# Patient Record
Sex: Female | Born: 1955
Health system: Southern US, Community
[De-identification: ages and names within clinical notes are randomized; demographics above are authoritative.]

## PROBLEM LIST (undated history)

## (undated) DIAGNOSIS — I10 Essential (primary) hypertension: Secondary | ICD-10-CM

## (undated) DIAGNOSIS — N950 Postmenopausal bleeding: Secondary | ICD-10-CM

## (undated) DIAGNOSIS — R21 Rash and other nonspecific skin eruption: Secondary | ICD-10-CM

## (undated) DIAGNOSIS — E079 Disorder of thyroid, unspecified: Secondary | ICD-10-CM

## (undated) HISTORY — PX: COLONOSCOPY: SHX174

## (undated) HISTORY — DX: Disorder of thyroid, unspecified: E07.9

## (undated) HISTORY — DX: Essential (primary) hypertension: I10

## (undated) HISTORY — PX: THYROID SURGERY: SHX805

## (undated) HISTORY — PX: TUBAL LIGATION: SHX77

---

## 2001-04-08 ENCOUNTER — Emergency Department (HOSPITAL_COMMUNITY): Admission: EM | Admit: 2001-04-08 | Discharge: 2001-04-08 | Payer: Self-pay | Admitting: Emergency Medicine

## 2005-02-13 ENCOUNTER — Emergency Department (HOSPITAL_COMMUNITY): Admission: EM | Admit: 2005-02-13 | Discharge: 2005-02-13 | Payer: Self-pay | Admitting: Emergency Medicine

## 2006-05-05 ENCOUNTER — Emergency Department (HOSPITAL_COMMUNITY): Admission: EM | Admit: 2006-05-05 | Discharge: 2006-05-05 | Payer: Self-pay | Admitting: Emergency Medicine

## 2006-08-13 ENCOUNTER — Emergency Department (HOSPITAL_COMMUNITY): Admission: EM | Admit: 2006-08-13 | Discharge: 2006-08-13 | Payer: Self-pay | Admitting: Emergency Medicine

## 2010-07-16 ENCOUNTER — Encounter: Admission: RE | Admit: 2010-07-16 | Discharge: 2010-07-16 | Payer: Self-pay | Admitting: Internal Medicine

## 2010-07-24 ENCOUNTER — Encounter: Admission: RE | Admit: 2010-07-24 | Discharge: 2010-07-24 | Payer: Self-pay | Admitting: Internal Medicine

## 2010-07-24 ENCOUNTER — Other Ambulatory Visit
Admission: RE | Admit: 2010-07-24 | Discharge: 2010-07-24 | Payer: Self-pay | Source: Home / Self Care | Admitting: Interventional Radiology

## 2010-08-13 ENCOUNTER — Ambulatory Visit (HOSPITAL_COMMUNITY)
Admission: RE | Admit: 2010-08-13 | Discharge: 2010-08-13 | Payer: Self-pay | Source: Home / Self Care | Attending: Internal Medicine | Admitting: Internal Medicine

## 2010-10-03 ENCOUNTER — Ambulatory Visit (HOSPITAL_COMMUNITY): Admission: RE | Admit: 2010-10-03 | Payer: BC Managed Care – PPO | Source: Home / Self Care | Admitting: General Surgery

## 2010-10-15 ENCOUNTER — Encounter (HOSPITAL_COMMUNITY)
Admission: RE | Admit: 2010-10-15 | Discharge: 2010-10-15 | Disposition: A | Discharge status: Home / Self Care | Payer: BC Managed Care – PPO | Source: Ambulatory Visit | Attending: General Surgery | Admitting: General Surgery

## 2010-10-15 DIAGNOSIS — Z01812 Encounter for preprocedural laboratory examination: Secondary | ICD-10-CM | POA: Insufficient documentation

## 2010-10-15 LAB — COMPREHENSIVE METABOLIC PANEL
BUN: 6 mg/dL (ref 6–23)
CO2: 29 mEq/L (ref 19–32)
Chloride: 105 mEq/L (ref 96–112)
Creatinine, Ser: 0.87 mg/dL (ref 0.4–1.2)
GFR calc non Af Amer: >60 mL/min (ref >60)
Total Bilirubin: 0.4 mg/dL (ref 0.3–1.2)

## 2010-10-15 LAB — DIFFERENTIAL
Basophils Relative: 0 % (ref 0–1)
Eosinophils Absolute: 0.4 10*3/uL (ref 0.0–0.7)
Eosinophils Relative: 5 % (ref 0–5)
Lymphs Abs: 3.2 10*3/uL (ref 0.7–4.0)
Neutrophils Relative %: 46 % (ref 43–77)

## 2010-10-15 LAB — URINALYSIS, ROUTINE W REFLEX MICROSCOPIC
Bilirubin Urine: NEGATIVE
Ketones, ur: NEGATIVE mg/dL
Nitrite: NEGATIVE
Specific Gravity, Urine: 1.005 (ref 1.005–1.030)
Urobilinogen, UA: 0.2 mg/dL (ref 0.0–1.0)

## 2010-10-15 LAB — CBC
MCV: 84 fL (ref 78.0–100.0)
Platelets: 243 10*3/uL (ref 150–400)
RDW: 15.1 % (ref 11.5–15.5)
WBC: 7.2 10*3/uL (ref 4.0–10.5)

## 2010-10-15 LAB — PROTIME-INR: INR: 0.93 (ref 0.00–1.49)

## 2010-10-15 LAB — SURGICAL PCR SCREEN
MRSA, PCR: NEGATIVE
Staphylococcus aureus: NEGATIVE

## 2010-10-19 ENCOUNTER — Other Ambulatory Visit: Payer: Self-pay | Admitting: General Surgery

## 2010-10-19 ENCOUNTER — Observation Stay (HOSPITAL_COMMUNITY)
Admission: RE | Admit: 2010-10-19 | Discharge: 2010-10-20 | Disposition: A | Discharge status: Home / Self Care | Payer: BC Managed Care – PPO | Source: Ambulatory Visit | Attending: General Surgery | Admitting: General Surgery

## 2010-10-19 DIAGNOSIS — E213 Hyperparathyroidism, unspecified: Secondary | ICD-10-CM | POA: Insufficient documentation

## 2010-10-19 DIAGNOSIS — I10 Essential (primary) hypertension: Secondary | ICD-10-CM | POA: Insufficient documentation

## 2010-10-19 DIAGNOSIS — E069 Thyroiditis, unspecified: Principal | ICD-10-CM | POA: Insufficient documentation

## 2010-10-19 LAB — CALCIUM: Calcium: 9.8 mg/dL (ref 8.4–10.5)

## 2010-10-22 NOTE — Op Note (Addendum)
Amy Schultz, Amy Schultz                 ACCOUNT NO.:  [id number]  MEDICAL RECORD NO.:  [id number]           PATIENT TYPE:  I  LOCATION:  2550                         FACILITY:  [facility]  PHYSICIAN:  Lennie Muckle, MD      DATE OF BIRTH:  06-25-1956  DATE OF PROCEDURE: DATE OF DISCHARGE:                              OPERATIVE REPORT   PREOPERATIVE DIAGNOSES: 1. Right thyroid nodule with follicular lesion. 2. Hyperparathyroidism.  POSTOPERATIVE DIAGNOSES: 1. Right thyroid nodule with follicular lesion. 2. Hyperparathyroidism.  PROCEDURE: 1. Right thyroidectomy. 2. Neck exploration.  SURGEON:  Karma Ansley L. Freida Busman, MD  ASSISTANT:  Velora Heckler, MD  ANESTHESIA:  General endotracheal anesthesia.  ESTIMATED BLOOD LOSS:  Minimal amount of blood loss.  SPECIMEN:  Right thyroid lobe permanent for pathology.  COMPLICATIONS:  No immediate complications.  INDICATION FOR PROCEDURE:  Ms. Vandermeulen is a 55 year old female who was originally seen for a right follicular nodule.  An ultrasound of the thyroid revealed a lesion within the right lobe of the thyroid.  FNA revealed hypercellular follicular lesion.  The ultrasound did not reveal any other abnormalities and no lesions in the left lobe.  Her initial evaluation also revealed hypercalcemia, PTH was elevated at 87.  She was sent for a sestamibi study for identification of a probable adenoma in the parathyroid gland.  In the interim, she had also seen Dr. Darnell Level. He agreed with the evaluation of removing the right lobe of the thyroid.  We felt that there was a probable parathyroid also on the right.  On initial discussion, we talked about the possibility of having to come back for a second surgery verus total thryoidectomy.    DETAILS OF PROCEDURE:  Ms. Dorman was identified in the preoperative holding area.  She received clindamycin and gentamicin.  She was seen by Anesthesia.  All questions were answered and she was taken to  the operating suite.  Once in the operating room, she was placed in supine position.  After administration of general endotracheal anesthesia, anterior neck was prepped and draped in usual sterile fashion.  Towel roll was placed beneath the shoulder blade.  She was placed in reversed Trendelenburg, head elevated up.  Surgical time-out performe.  I began by palpating the sternal cleft, measured 2 fingerbreadths above the sternal cleft and placed an approximately 7 cm incision.  Divided subcutaneous tissues with electrocautery.  Divided the platysma muscle, created flaps inferiorly and superiorly and localized the middle of the strap muscles. These were divided.  We elevated the strap muscles off the left thyroid. There were no palpable lesions and no enlarged glands identified.  We then proceeded to dissect the strap muscle off the right thyroid.  It was somewhat adhered to the gland from the previous FNA.  Using the Harmonic scalpel, I dissected the strap muscles off the thyroid.  There seemed to be a capsule around the thyroid, which was fibrous and thick.I dissected the inferior vessels first placing small clips on the inferior thyroid vessel and divided this with the Harmonic scalpel and then proceeded up towards the middle lobe of the  gland.  I freed up some of the adhesions of the strap muscle from the thyroid, then I was able to retract the gland inferiorly.  I felt the lesion within the thyroid. There was both the inferior and the superior lesion palpated.  We identified the superior feeding vessels, placed 2-0 silk ties and a large clip superiorly.  Divided this with Metzenbaum scissors.  A smaller feeding branch was clipped and divided.  This then allowed to retract the superior pole into the operative field.  I continued dissecting down towards the middle of the gland.  We were able to identify the recurrent laryngeal nerve.  I stayed high on the gland, well away from the nerve.   Pushing the nerve away from the gland, I used the Harmonic scalpel to dissect on the thyroid.  I clipped the smaller feeding vessels.  The middle thyroidal artery was also clipped and divided.  I was then able to continue sweeping the gland off the trachea using electrocautery.  The gland was divided in the isthmus with the Harmonic scalpel.  I marked a stitch in the superior pole.  I palpated and again, there was a lesion in the superior and inferior gland.  We then dissected laterally well identifying the nerve.  I found a small lymph node inferiorly.  I identified the thymus.  We did not find a large inferior parathyroid gland.  We dissected around the carotid groove, the tracheoesophageal groove and superiorly as well.  A normal- sized superior parathyroid was identified.  We reviewed the sestamibi scan once again.  It seemed that it localized in the vicinity of the follicular lesion.  We then decided to once again explore the left side of the neck.  We pulled the strap muscles off the thyroid, swept away the strap muscles in order to identify the full length of the gland.  No nodularity was noted in the thyroid.  We did not identify any enlargedparathyroid tissue on the left.  Therefore, it is presumed that perhaps the parathyroid was within the thyroid gland.  I irrigated the wound bed, placed Surgicel on both sides, closed the strap muscle at the midline with a 3-0 Vicryl running suture, re-approximated the platysma with 3-0 interrupted.  Injected 10 mL of 0.25% Marcaine with epinephrine for local anesthetic.  I reapproximated the dermis with 3-0 Vicryl and skin was closed with 4-0 Monocryl and Steri-Strips was placed for final dressing.  The patient was extubated, transferred to postanesthesia care unit in stable condition.  I will get a calcium today, repeat it tomorrow and then she will likely be discharged home tomorrow.     Lennie Muckle, MD     ALA/MEDQ  D:  10/19/2010   T:  10/19/2010  Job:  [id number]  cc:   Juline Patch, M.D. Fax: [phone]  Electronically Signed by Bertram Savin MD on 10/22/2010 11:51:22 AM

## 2010-11-13 NOTE — Discharge Summary (Signed)
  Amy Schultz, Amy Schultz                 ACCOUNT NO.:  [id number]  MEDICAL RECORD NO.:  [id number]           PATIENT TYPE:  LOCATION:                                 FACILITY:  PHYSICIAN:  Lennie Muckle, MD      DATE OF BIRTH:  1956-08-05  DATE OF ADMISSION:  10/21/2010 DATE OF DISCHARGE:  10/22/2010                              DISCHARGE SUMMARY   FINAL DIAGNOSIS:  Right thyroid nodule and primary hyperparathyroidism.  PROCEDURE:  On February 25 right parathyroidectomy and right thyroidectomy.  HOSPITAL COURSE:  Ms. Gonzalo was observed overnight following a right thyroidectomy and neck exploration.  She had had a primary hyperparathyroidism, had a lesion palpable in the thyroid which was assumed to be a parathyroid gland.  Underwent the procedure without difficulty.  Calcium on discharge was 9.3.  She will be discharged home with Vicodin for pain, restrict activities for 10 days, and then follow up with me in 2 or 3 weeks' time.  FINAL DIAGNOSIS:  Primary hyperparathyroidism and right thyroid nodule.     Lennie Muckle, MD     ALA/MEDQ  D:  11/05/2010  T:  11/06/2010  Job:  [id number]  Electronically Signed by Bertram Savin MD on 11/13/2010 11:48:17 AM

## 2010-11-28 ENCOUNTER — Other Ambulatory Visit: Payer: Self-pay | Admitting: Internal Medicine

## 2010-11-28 DIAGNOSIS — Z1231 Encounter for screening mammogram for malignant neoplasm of breast: Secondary | ICD-10-CM

## 2010-12-17 ENCOUNTER — Other Ambulatory Visit: Payer: Self-pay | Admitting: Women's Health

## 2010-12-17 ENCOUNTER — Other Ambulatory Visit (HOSPITAL_COMMUNITY)
Admission: RE | Admit: 2010-12-17 | Discharge: 2010-12-17 | Disposition: A | Discharge status: Home / Self Care | Payer: BC Managed Care – PPO | Source: Ambulatory Visit | Attending: Gynecology | Admitting: Gynecology

## 2010-12-17 ENCOUNTER — Ambulatory Visit (INDEPENDENT_AMBULATORY_CARE_PROVIDER_SITE_OTHER): Payer: BC Managed Care – PPO | Admitting: Women's Health

## 2010-12-17 DIAGNOSIS — Z124 Encounter for screening for malignant neoplasm of cervix: Secondary | ICD-10-CM | POA: Insufficient documentation

## 2010-12-17 DIAGNOSIS — Z01419 Encounter for gynecological examination (general) (routine) without abnormal findings: Secondary | ICD-10-CM

## 2010-12-17 DIAGNOSIS — N912 Amenorrhea, unspecified: Secondary | ICD-10-CM

## 2010-12-21 ENCOUNTER — Ambulatory Visit
Admission: RE | Admit: 2010-12-21 | Discharge: 2010-12-21 | Disposition: A | Discharge status: Home / Self Care | Payer: BC Managed Care – PPO | Source: Ambulatory Visit | Attending: Internal Medicine | Admitting: Internal Medicine

## 2010-12-21 ENCOUNTER — Ambulatory Visit: Payer: BC Managed Care – PPO

## 2010-12-21 DIAGNOSIS — Z1231 Encounter for screening mammogram for malignant neoplasm of breast: Secondary | ICD-10-CM

## 2010-12-25 ENCOUNTER — Other Ambulatory Visit: Payer: Self-pay | Admitting: Internal Medicine

## 2010-12-25 DIAGNOSIS — R928 Other abnormal and inconclusive findings on diagnostic imaging of breast: Secondary | ICD-10-CM

## 2010-12-28 ENCOUNTER — Other Ambulatory Visit: Payer: BC Managed Care – PPO

## 2010-12-28 ENCOUNTER — Ambulatory Visit
Admission: RE | Admit: 2010-12-28 | Discharge: 2010-12-28 | Disposition: A | Discharge status: Home / Self Care | Payer: BC Managed Care – PPO | Source: Ambulatory Visit | Attending: Internal Medicine | Admitting: Internal Medicine

## 2010-12-28 DIAGNOSIS — R928 Other abnormal and inconclusive findings on diagnostic imaging of breast: Secondary | ICD-10-CM

## 2011-01-29 ENCOUNTER — Encounter (INDEPENDENT_AMBULATORY_CARE_PROVIDER_SITE_OTHER): Payer: Self-pay | Admitting: General Surgery

## 2011-03-28 ENCOUNTER — Encounter: Payer: Self-pay | Admitting: Internal Medicine

## 2011-09-16 ENCOUNTER — Encounter: Payer: Self-pay | Admitting: Women's Health

## 2011-09-16 ENCOUNTER — Telehealth: Payer: Self-pay | Admitting: *Deleted

## 2011-09-16 ENCOUNTER — Ambulatory Visit (INDEPENDENT_AMBULATORY_CARE_PROVIDER_SITE_OTHER): Payer: BC Managed Care – PPO | Admitting: Women's Health

## 2011-09-16 VITALS — BP 138/80

## 2011-09-16 DIAGNOSIS — N951 Menopausal and female climacteric states: Secondary | ICD-10-CM

## 2011-09-16 MED ORDER — ESTRADIOL-NORETHINDRONE ACET 0.5-0.1 MG PO TABS: 1.0000 | ORAL_TABLET | Freq: Every day | ORAL | Status: DC

## 2011-09-16 NOTE — Telephone Encounter (Signed)
Estradiol 1 mg by mouth daily #30 with 4 refills and Provera 2.5 by mouth daily #30 with 4 refills. Remind patient she needs both medications since she has a uterus.

## 2011-09-16 NOTE — Progress Notes (Signed)
Patient ID: Amy Schultz, female   DOB: August 29, 1955, 56 y.o.   MRN: [id number] Presents with a complaint of increased hot flushes, difficulty losing weight, fatigue, and relates to menopause. History of an elevated FSH/70 in July of 2012. No bleeding since/BTL. Hypertensive on 1 medication per primary care, blood pressure stable.  Menopausal symptoms  Plan: Options reviewed of HRT, Effexor, supplements. HRT- risks of blood clots, strokes, and breast cancer reviewed. [facility] study reviewed. Would like to try HRT. Activella 0.5/.1 prescription, proper use, given reviewed. Will check blood pressure next week, keep followup with primary care for blood pressure. States she can have blood pressure checked at work. Will evaluate at annual exam or sooner if needed.

## 2011-09-16 NOTE — Telephone Encounter (Signed)
Pt was seen today c/o menopausal symptoms given Rx for Activella 0.5-01mg  is too expensive, pt would like a cheaper medication. Please advise

## 2011-09-17 MED ORDER — ESTRADIOL 1 MG PO TABS: 1.0000 mg | ORAL_TABLET | Freq: Every day | ORAL | Status: DC

## 2011-09-17 MED ORDER — MEDROXYPROGESTERONE ACETATE 2.5 MG PO TABS: 2.5000 mg | ORAL_TABLET | Freq: Every day | ORAL | Status: DC

## 2011-09-17 NOTE — Telephone Encounter (Signed)
Left the below note on pt vm that rx sent to pharmacy. Pt will call if questions.

## 2011-10-08 ENCOUNTER — Telehealth: Payer: Self-pay

## 2011-10-08 NOTE — Telephone Encounter (Signed)
Patient has been on daily Estradiol and Provera for couple of weeks. Past Friday night began with spotting and continued until Sunday began with a flow like menses and has been like that every day since.  Please advise.  Also, patient asked me "do these hormones cause cancer?".  I told her you would address that answer with her. Thanks.

## 2011-10-09 NOTE — Telephone Encounter (Signed)
Message left to call office in regards to problem

## 2011-10-09 NOTE — Telephone Encounter (Signed)
Left message on cell to call office in regards to problem.

## 2011-10-10 ENCOUNTER — Encounter (INDEPENDENT_AMBULATORY_CARE_PROVIDER_SITE_OTHER): Payer: Self-pay | Admitting: General Surgery

## 2011-10-10 NOTE — Telephone Encounter (Signed)
Telephone call to review HRT. Will continue the estradiol daily and take 5 mg of Provera day 1 through 10. Will stop daily Provera and take cyclic. Again reviewed risks of blood clots, strokes, breast cancer risk with HRT. States has felt better on HRT will continue and it reviewed to stay on less than 5 years.

## 2011-11-07 ENCOUNTER — Ambulatory Visit (INDEPENDENT_AMBULATORY_CARE_PROVIDER_SITE_OTHER): Payer: BC Managed Care – PPO

## 2011-11-07 ENCOUNTER — Encounter: Payer: Self-pay | Admitting: Women's Health

## 2011-11-07 ENCOUNTER — Other Ambulatory Visit: Payer: Self-pay | Admitting: Gynecology

## 2011-11-07 ENCOUNTER — Ambulatory Visit (INDEPENDENT_AMBULATORY_CARE_PROVIDER_SITE_OTHER): Payer: BC Managed Care – PPO | Admitting: Women's Health

## 2011-11-07 DIAGNOSIS — N83339 Acquired atrophy of ovary and fallopian tube, unspecified side: Secondary | ICD-10-CM

## 2011-11-07 DIAGNOSIS — R109 Unspecified abdominal pain: Secondary | ICD-10-CM

## 2011-11-07 DIAGNOSIS — I10 Essential (primary) hypertension: Secondary | ICD-10-CM | POA: Insufficient documentation

## 2011-11-07 LAB — URINALYSIS W MICROSCOPIC + REFLEX CULTURE
Bilirubin Urine: NEGATIVE
Hgb urine dipstick: NEGATIVE
Ketones, ur: NEGATIVE mg/dL
Protein, ur: NEGATIVE mg/dL
Urobilinogen, UA: 0.2 mg/dL (ref 0.0–1.0)

## 2011-11-07 LAB — WET PREP FOR TRICH, YEAST, CLUE: Trich, Wet Prep: NONE SEEN

## 2011-11-07 NOTE — Patient Instructions (Signed)
Colonoscopy  Dr  Loreta Ave, Dr. Elnoria Howard  [facility]

## 2011-11-07 NOTE — Progress Notes (Signed)
Patient ID: Amy Schultz, female   DOB: 1956/06/06, 56 y.o.   MRN: [id number] Presents with the complaint of lower left abdominal pain for several weeks, compares to menstrual cramps. Postmenopausal with no bleeding on no HRT. Denies any change in discharge, no  urinary pain, occasional frequency. Denies any fever, nausea, or changes in elimination. Same partner.  Exam: No CVAT, UA  negative. Abdominal exam: Abdomen soft with no rebound or radiation of pain. External genitalia within normal limits, . speculum exam - cervix without lesion slight vaginal atrophy scant discharge - wet prep negative. Bimanual no CMT,  adnexal fullness or tenderness with exam.   Ultrasound: Anteverted uterus. Right ovary atrophic. Left ovary atrophic located behind posterior wall of the uterus. No apparent masses noted. Negative cul-de-sac.  Reviewed normal labs and exam. Has not had a screening colonoscopy, will schedule. Encouraged to continue increased fiber rich foods,  Motrin as needed for discomfort.

## 2011-11-18 ENCOUNTER — Encounter: Payer: Self-pay | Admitting: Gastroenterology

## 2011-12-03 ENCOUNTER — Ambulatory Visit (AMBULATORY_SURGERY_CENTER): Payer: BC Managed Care – PPO | Admitting: *Deleted

## 2011-12-03 VITALS — Ht 71.0 in | Wt 232.2 lb

## 2011-12-03 DIAGNOSIS — Z1211 Encounter for screening for malignant neoplasm of colon: Secondary | ICD-10-CM

## 2011-12-03 MED ORDER — PEG-KCL-NACL-NASULF-NA ASC-C 100 G PO SOLR: ORAL | Status: DC

## 2011-12-17 ENCOUNTER — Encounter: Payer: BC Managed Care – PPO | Admitting: Gastroenterology

## 2011-12-18 ENCOUNTER — Other Ambulatory Visit: Payer: Self-pay | Admitting: Internal Medicine

## 2011-12-18 DIAGNOSIS — Z1231 Encounter for screening mammogram for malignant neoplasm of breast: Secondary | ICD-10-CM

## 2011-12-19 ENCOUNTER — Encounter: Payer: BC Managed Care – PPO | Admitting: Women's Health

## 2011-12-25 ENCOUNTER — Ambulatory Visit (AMBULATORY_SURGERY_CENTER): Payer: BC Managed Care – PPO | Admitting: Gastroenterology

## 2011-12-25 ENCOUNTER — Encounter: Payer: Self-pay | Admitting: Gastroenterology

## 2011-12-25 VITALS — BP 156/70 | HR 58 | Temp 96.8°F | Resp 18 | Ht 71.0 in | Wt 232.0 lb

## 2011-12-25 DIAGNOSIS — Z1211 Encounter for screening for malignant neoplasm of colon: Secondary | ICD-10-CM

## 2011-12-25 MED ORDER — SODIUM CHLORIDE 0.9 % IV SOLN: 500.0000 mL | INTRAVENOUS | Status: DC

## 2011-12-25 NOTE — Op Note (Signed)
[facility] [address] [place], [place]  [address]  COLONOSCOPY PROCEDURE REPORT  PATIENT:  Amy Schultz, Amy Schultz  MR#:  [id number] BIRTHDATE:  10-Aug-1956, 55 yrs. old  GENDER:  female ENDOSCOPIST:  Vania Rea. Jarold Motto, MD, [facility] REF. BY:  Juline Patch, M.D. PROCEDURE DATE:  12/25/2011 PROCEDURE:  Diagnostic Colonoscopy, Average-risk screening colonoscopy G0121 ASA CLASS:  Class II INDICATIONS:  Routine Risk Screening MEDICATIONS:   propofol (Diprivan) 200 mg IV  DESCRIPTION OF PROCEDURE:   After the risks and benefits and of the procedure were explained, informed consent was obtained. Digital rectal exam was performed and revealed no abnormalities. The LB CF-H180AL [id number] endoscope was introduced through the anus and advanced to the cecum, which was identified by both the appendix and ileocecal valve.  The quality of the prep was excellent, using MoviPrep.  The instrument was then slowly withdrawn as the colon was fully examined. <<PROCEDUREIMAGES>>  FINDINGS:  No polyps or cancers were seen.  Otherwise normal colonoscopy without other polyps, masses, vascular ectasias, or inflammatory changes.   Retroflexed views in the rectum revealed no abnormalities.    The scope was then withdrawn from the patient and the procedure completed.  COMPLICATIONS:  None ENDOSCOPIC IMPRESSION: 1) No polyps or cancers 2) Otherwise nl colonoscopy WMO RECOMMENDATIONS: 1) Continue current colorectal screening recommendations for "routine risk" patients with a repeat colonoscopy in 10 years.  REPEAT EXAM:  No  ______________________________ Vania Rea. Jarold Motto, MD, [facility]  CC:  n. eSIGNED:   Vania Rea. Rella Egelston at 12/25/2011 01:59 PM  Andree Moro, [phone]

## 2011-12-25 NOTE — Progress Notes (Signed)
Propofol per Dwan Bolt CRNA. See scanned intra procedure report. ewm

## 2011-12-25 NOTE — Patient Instructions (Signed)
YOU HAD AN ENDOSCOPIC PROCEDURE TODAY AT THE [facility]: Refer to the procedure report that was given to you for any specific questions about what was found during the examination.  If the procedure report does not answer your questions, please call your gastroenterologist to clarify.  If you requested that your care partner not be given the details of your procedure findings, then the procedure report has been included in a sealed envelope for you to review at your convenience later.  YOU SHOULD EXPECT: Some feelings of bloating in the abdomen. Passage of more gas than usual.  Walking can help get rid of the air that was put into your GI tract during the procedure and reduce the bloating. If you had a lower endoscopy (such as a colonoscopy or flexible sigmoidoscopy) you may notice spotting of blood in your stool or on the toilet paper. If you underwent a bowel prep for your procedure, then you may not have a normal bowel movement for a few days.  DIET: Your first meal following the procedure should be a light meal and then it is ok to progress to your normal diet.  A half-sandwich or bowl of soup is an example of a good first meal.  Heavy or fried foods are harder to digest and may make you feel nauseous or bloated.  Likewise meals heavy in dairy and vegetables can cause extra gas to form and this can also increase the bloating.  Drink plenty of fluids but you should avoid alcoholic beverages for 24 hours.  ACTIVITY: Your care partner should take you home directly after the procedure.  You should plan to take it easy, moving slowly for the rest of the day.  You can resume normal activity the day after the procedure however you should NOT DRIVE or use heavy machinery for 24 hours (because of the sedation medicines used during the test).    SYMPTOMS TO REPORT IMMEDIATELY: A gastroenterologist can be reached at any hour.  During normal business hours, 8:30 AM to 5:00 PM Monday through Friday,  call [phone].  After hours and on weekends, please call the GI answering service at [phone] who will take a message and have the physician on call contact you.   Following lower endoscopy (colonoscopy or flexible sigmoidoscopy):  Excessive amounts of blood in the stool  Significant tenderness or worsening of abdominal pains  Swelling of the abdomen that is new, acute  Fever of 100F or higher  Following upper endoscopy (EGD)  Vomiting of blood or coffee ground material  New chest pain or pain under the shoulder blades  Painful or persistently difficult swallowing  New shortness of breath  Fever of 100F or higher  Black, tarry-looking stools  FOLLOW UP: If any biopsies were taken you will be contacted by phone or by letter within the next 1-3 weeks.  Call your gastroenterologist if you have not heard about the biopsies in 3 weeks.  Our staff will call the home number listed on your records the next business day following your procedure to check on you and address any questions or concerns that you may have at that time regarding the information given to you following your procedure. This is a courtesy call and so if there is no answer at the home number and we have not heard from you through the emergency physician on call, we will assume that you have returned to your regular daily activities without incident.  SIGNATURES/CONFIDENTIALITY: You and/or your care   partner have signed paperwork which will be entered into your electronic medical record.  These signatures attest to the fact that that the information above on your After Visit Summary has been reviewed and is understood.  Full responsibility of the confidentiality of this discharge information lies with you and/or your care-partner.   Repeat colonoscopy in 10 years  

## 2011-12-25 NOTE — Progress Notes (Signed)
Patient did not have preoperative order for IV antibiotic SSI prophylaxis. (G8918)  Patient did not experience any of the following events: a burn prior to discharge; a fall within the facility; wrong site/side/patient/procedure/implant event; or a hospital transfer or hospital admission upon discharge from the facility. (G8907)  

## 2011-12-26 ENCOUNTER — Telehealth: Payer: Self-pay | Admitting: *Deleted

## 2011-12-26 NOTE — Telephone Encounter (Signed)
Called number left in admitting yesterday. No option to leave a message. Number tired twice with same results. ewm

## 2011-12-30 ENCOUNTER — Ambulatory Visit
Admission: RE | Admit: 2011-12-30 | Discharge: 2011-12-30 | Disposition: A | Discharge status: Home / Self Care | Payer: Self-pay | Source: Ambulatory Visit | Attending: Internal Medicine | Admitting: Internal Medicine

## 2011-12-30 DIAGNOSIS — Z1231 Encounter for screening mammogram for malignant neoplasm of breast: Secondary | ICD-10-CM

## 2012-01-14 ENCOUNTER — Other Ambulatory Visit: Payer: Self-pay | Admitting: Internal Medicine

## 2012-01-14 DIAGNOSIS — E041 Nontoxic single thyroid nodule: Secondary | ICD-10-CM

## 2012-01-24 ENCOUNTER — Ambulatory Visit
Admission: RE | Admit: 2012-01-24 | Discharge: 2012-01-24 | Disposition: A | Discharge status: Home / Self Care | Payer: BC Managed Care – PPO | Source: Ambulatory Visit | Attending: Internal Medicine | Admitting: Internal Medicine

## 2012-01-24 DIAGNOSIS — E041 Nontoxic single thyroid nodule: Secondary | ICD-10-CM

## 2012-11-21 ENCOUNTER — Emergency Department (HOSPITAL_COMMUNITY)
Admission: EM | Admit: 2012-11-21 | Discharge: 2012-11-21 | Disposition: A | Discharge status: Home / Self Care | Payer: BC Managed Care – PPO | Attending: Emergency Medicine | Admitting: Emergency Medicine

## 2012-11-21 ENCOUNTER — Emergency Department (HOSPITAL_COMMUNITY): Payer: BC Managed Care – PPO

## 2012-11-21 ENCOUNTER — Encounter (HOSPITAL_COMMUNITY): Payer: Self-pay | Admitting: Emergency Medicine

## 2012-11-21 DIAGNOSIS — I10 Essential (primary) hypertension: Secondary | ICD-10-CM | POA: Insufficient documentation

## 2012-11-21 DIAGNOSIS — Z862 Personal history of diseases of the blood and blood-forming organs and certain disorders involving the immune mechanism: Secondary | ICD-10-CM | POA: Insufficient documentation

## 2012-11-21 DIAGNOSIS — S93409A Sprain of unspecified ligament of unspecified ankle, initial encounter: Secondary | ICD-10-CM | POA: Insufficient documentation

## 2012-11-21 DIAGNOSIS — Z79899 Other long term (current) drug therapy: Secondary | ICD-10-CM | POA: Insufficient documentation

## 2012-11-21 DIAGNOSIS — Z8639 Personal history of other endocrine, nutritional and metabolic disease: Secondary | ICD-10-CM | POA: Insufficient documentation

## 2012-11-21 DIAGNOSIS — W108XXA Fall (on) (from) other stairs and steps, initial encounter: Secondary | ICD-10-CM | POA: Insufficient documentation

## 2012-11-21 DIAGNOSIS — Y939 Activity, unspecified: Secondary | ICD-10-CM | POA: Insufficient documentation

## 2012-11-21 DIAGNOSIS — S93402A Sprain of unspecified ligament of left ankle, initial encounter: Secondary | ICD-10-CM

## 2012-11-21 DIAGNOSIS — Y929 Unspecified place or not applicable: Secondary | ICD-10-CM | POA: Insufficient documentation

## 2012-11-21 MED ORDER — TRAMADOL HCL 50 MG PO TABS: 50.0000 mg | ORAL_TABLET | Freq: Four times a day (QID) | ORAL | Status: DC | PRN

## 2012-11-21 NOTE — ED Provider Notes (Signed)
History     CSN: [id number]  Arrival date & time 11/21/12  6578   First MD Initiated Contact with Patient 11/21/12 1010      Chief Complaint  Patient presents with  . Ankle Pain    (Consider location/radiation/quality/duration/timing/severity/associated sxs/prior treatment) HPI  Amy Schultz is a 57 y.o. female complaining of left ankle pain described as severe, 8/10, exacerbated by  movement and weightbearing. Patient had a slip and fall 5 weeks ago with severe bruising and swelling to the area. Denies numbness, tingling, difficulty ambulating. 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 She worked last night with exacerbation of the pain. He been taking Aleve with good relief of the pain however today's episode is not alleviated by Aleve.  Past Medical History  Diagnosis Date  . Hypertension   . Thyroid disease     Past Surgical History  Procedure Laterality Date  . Thyroid surgery      didn't remove thyroid  . Tubal ligation      Family History  Problem Relation Age of Onset  . Diabetes Mother   . Hypertension Mother   . Breast cancer Mother   . Hypertension Sister   . Breast cancer Sister   . Breast cancer Maternal Aunt   . Diabetes Maternal Grandmother   . Colon cancer Neg Hx   . Esophageal cancer Neg Hx   . Rectal cancer Neg Hx   . Stomach cancer Neg Hx     History  Substance Use Topics  . Smoking status: Never Smoker   . Smokeless tobacco: Never Used  . Alcohol Use: No    OB History   Grav Para Term Preterm Abortions TAB SAB Ect Mult Living   4 3   1  1          Review of Systems  Constitutional: Negative for fever.  Respiratory: Negative for shortness of breath.   Cardiovascular: Negative for chest pain.  Gastrointestinal: Negative for nausea, vomiting, abdominal pain and diarrhea.  Musculoskeletal: Positive for arthralgias.  All other systems reviewed and are negative.    Allergies  Codeine; Darvocet; Demerol;  Hydrocodone; and Penicillins  Home Medications   Current Outpatient Rx  Name  Route  Sig  Dispense  Refill  . PRESCRIPTION MEDICATION   Oral   Take 1 tablet by mouth daily. Blood pressure medication           BP 173/90  Pulse 79  Temp(Src) 99.1 F (37.3 C) (Oral)  Resp 18  SpO2 99%  Physical Exam  Nursing note and vitals reviewed. Constitutional: She is oriented to person, place, and time. She appears well-developed and well-nourished. No distress.  HENT:  Head: Normocephalic.  Eyes: Conjunctivae and EOM are normal.  Cardiovascular: Normal rate.   Pulmonary/Chest: Effort normal. No stridor.  Musculoskeletal: Normal range of motion.       Feet:  Neurological: She is alert and oriented to person, place, and time.  Psychiatric: She has a normal mood and affect.    ED Course  Procedures (including critical care time)  Labs Reviewed - No data to display Dg Ankle Complete Left  11/21/2012  *RADIOLOGY REPORT*  Clinical Data: Fall with pain and swelling.  LEFT ANKLE COMPLETE - 3+ VIEW  Comparison: None.  Findings: Three views of the left ankle were obtained.  The left ankle is located.  No evidence for acute fracture. Concern for lateral soft tissue swelling.  Mild spurring at the plantar aspect of  the calcaneus.  IMPRESSION: No acute bony abnormality.  Lateral soft tissue swelling.   Original Report Authenticated By: Richarda Overlie, M.D.      1. Left ankle sprain, initial encounter       MDM   Amy Schultz is a 57 y.o. female with severe left ankle pain 5 weeks status post slip and fall. X-rays negative. I'll treat as a sprain and advised ortho followup.    Filed Vitals:   11/21/12 0949  BP: 173/90  Pulse: 79  Temp: 99.1 F (37.3 C)  TempSrc: Oral  Resp: 18  SpO2: 99%     Pt verbalized understanding and agrees with care plan. Outpatient follow-up and return precautions given.    New Prescriptions   TRAMADOL (ULTRAM) 50 MG TABLET    Take 1 tablet (50 mg total)  by mouth every 6 (six) hours as needed for pain.           Joni Reining Reighlyn Elmes, PA-C 11/21/12 1120

## 2012-11-21 NOTE — ED Notes (Signed)
Onset 5 weeks ago slipped fell down stairs and injured left lateral ankle radiating to toes.  Pain constant and worsening overtime.

## 2012-11-22 NOTE — ED Provider Notes (Signed)
Medical screening examination/treatment/procedure(s) were performed by non-physician practitioner and as supervising physician I was immediately available for consultation/collaboration.    Vida Roller, MD 11/22/12 [phone]

## 2013-05-31 IMAGING — CR DG ANKLE COMPLETE 3+V*L*
3 series · 3 of 3 positions shown · non-contrast
Comparison: None.

CLINICAL DATA: Fall with pain and swelling.

LEFT ANKLE COMPLETE - 3+ VIEW

[x ankle ap left]
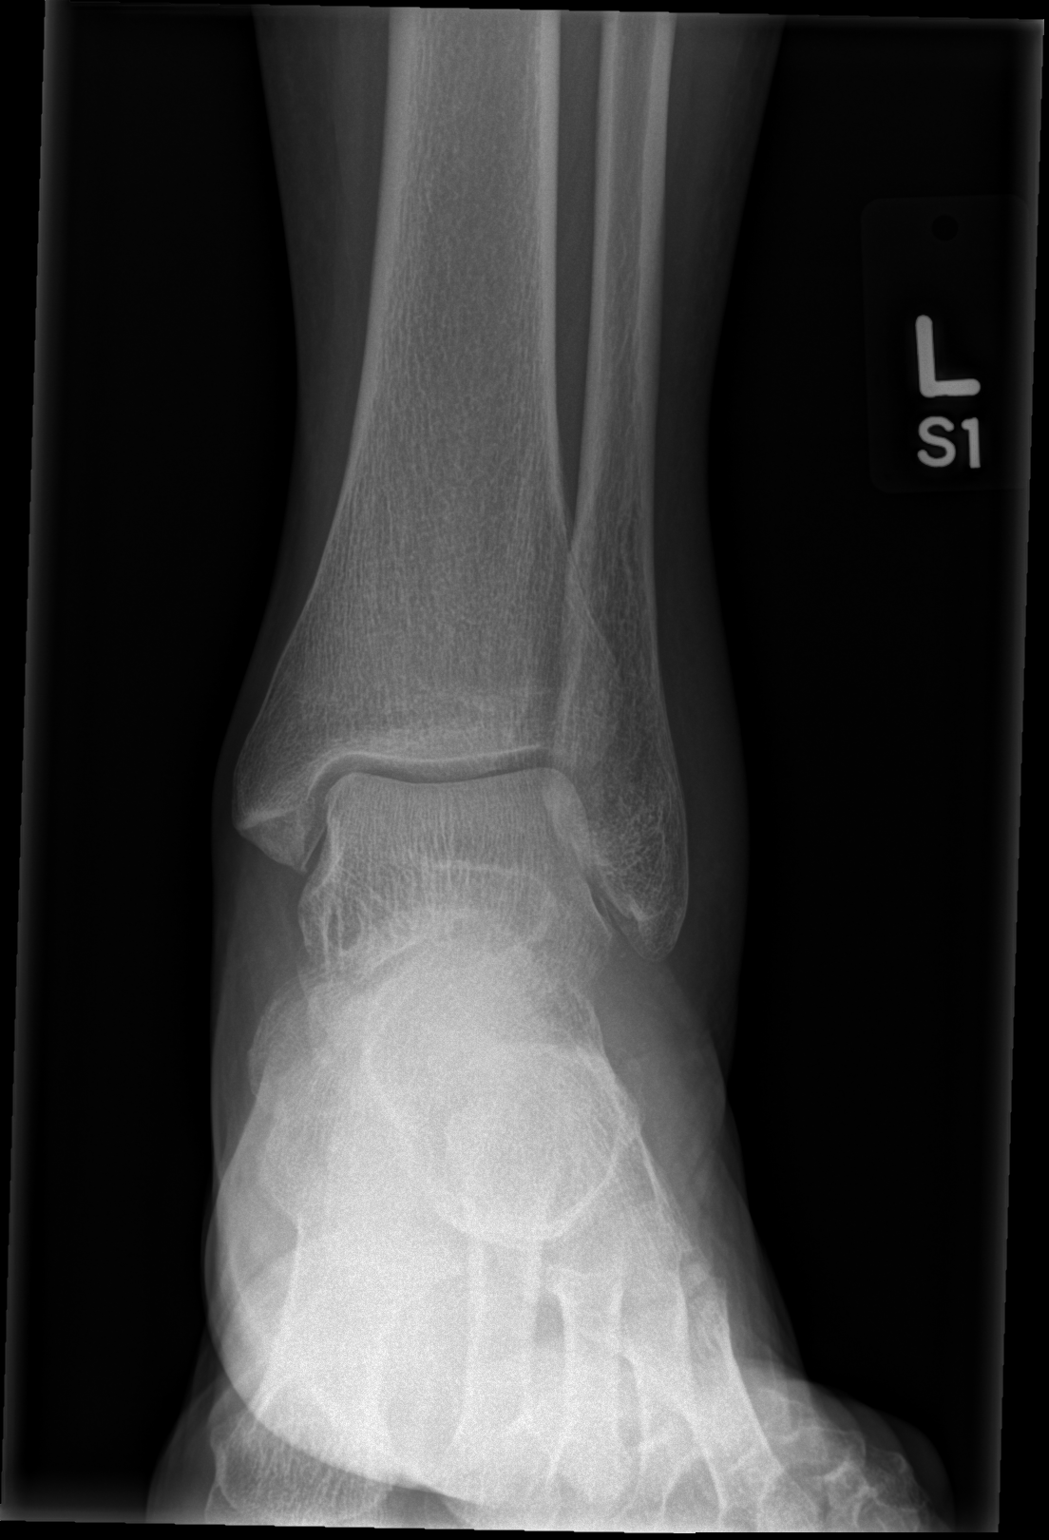

[x ankle obl left]
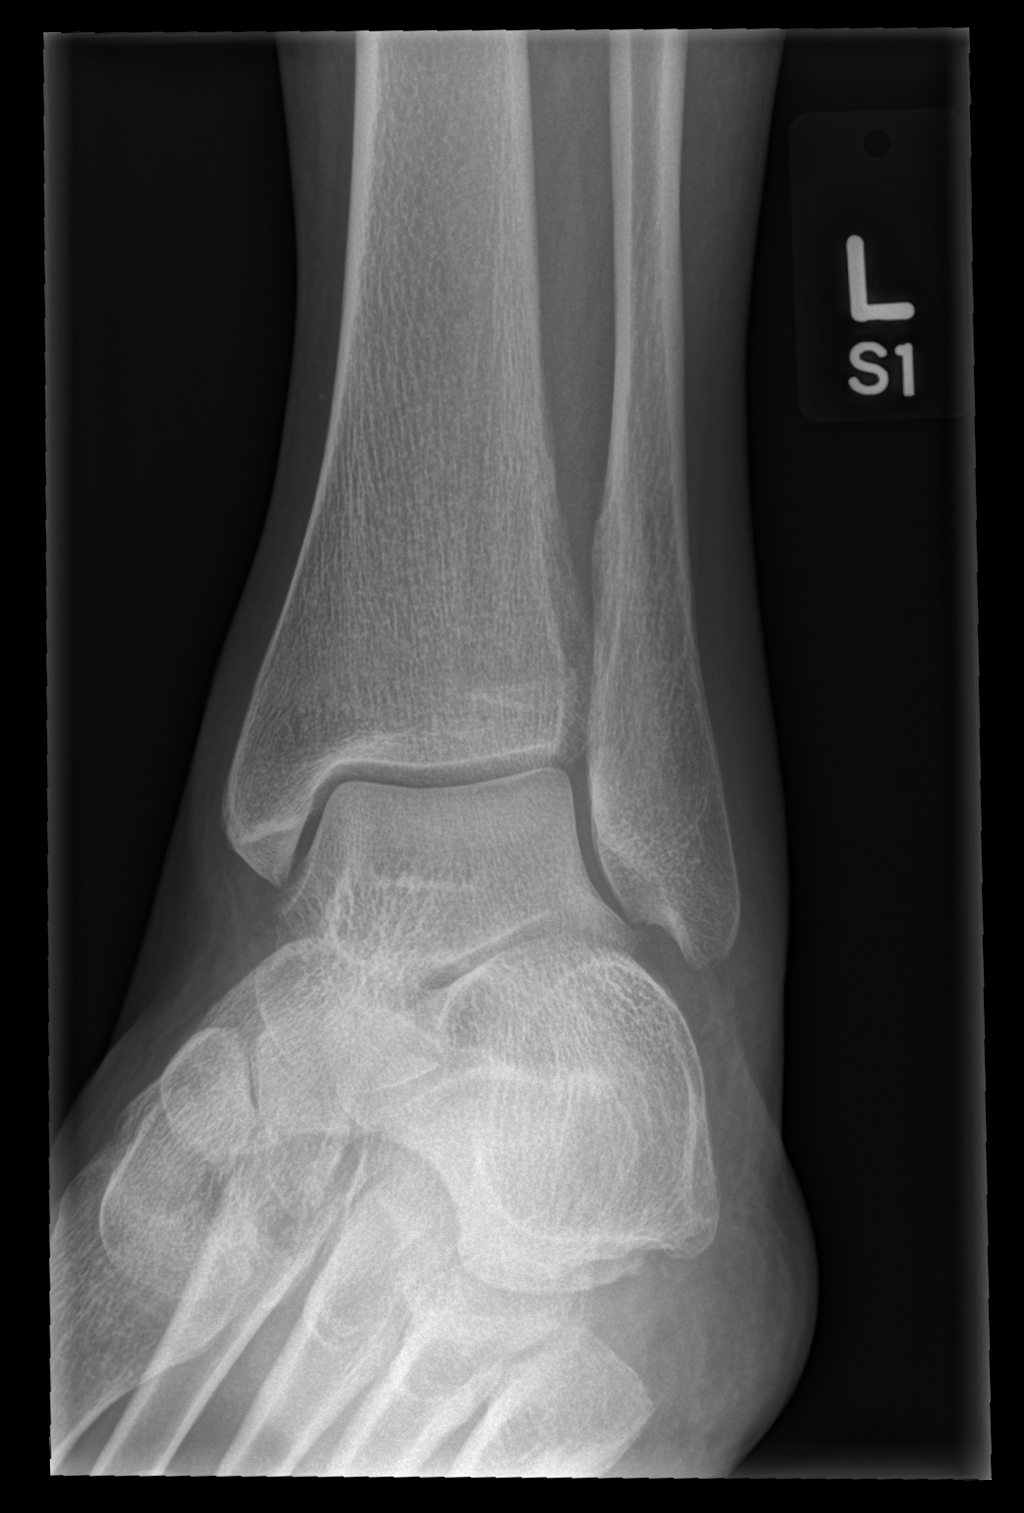

[x ankle lat left]
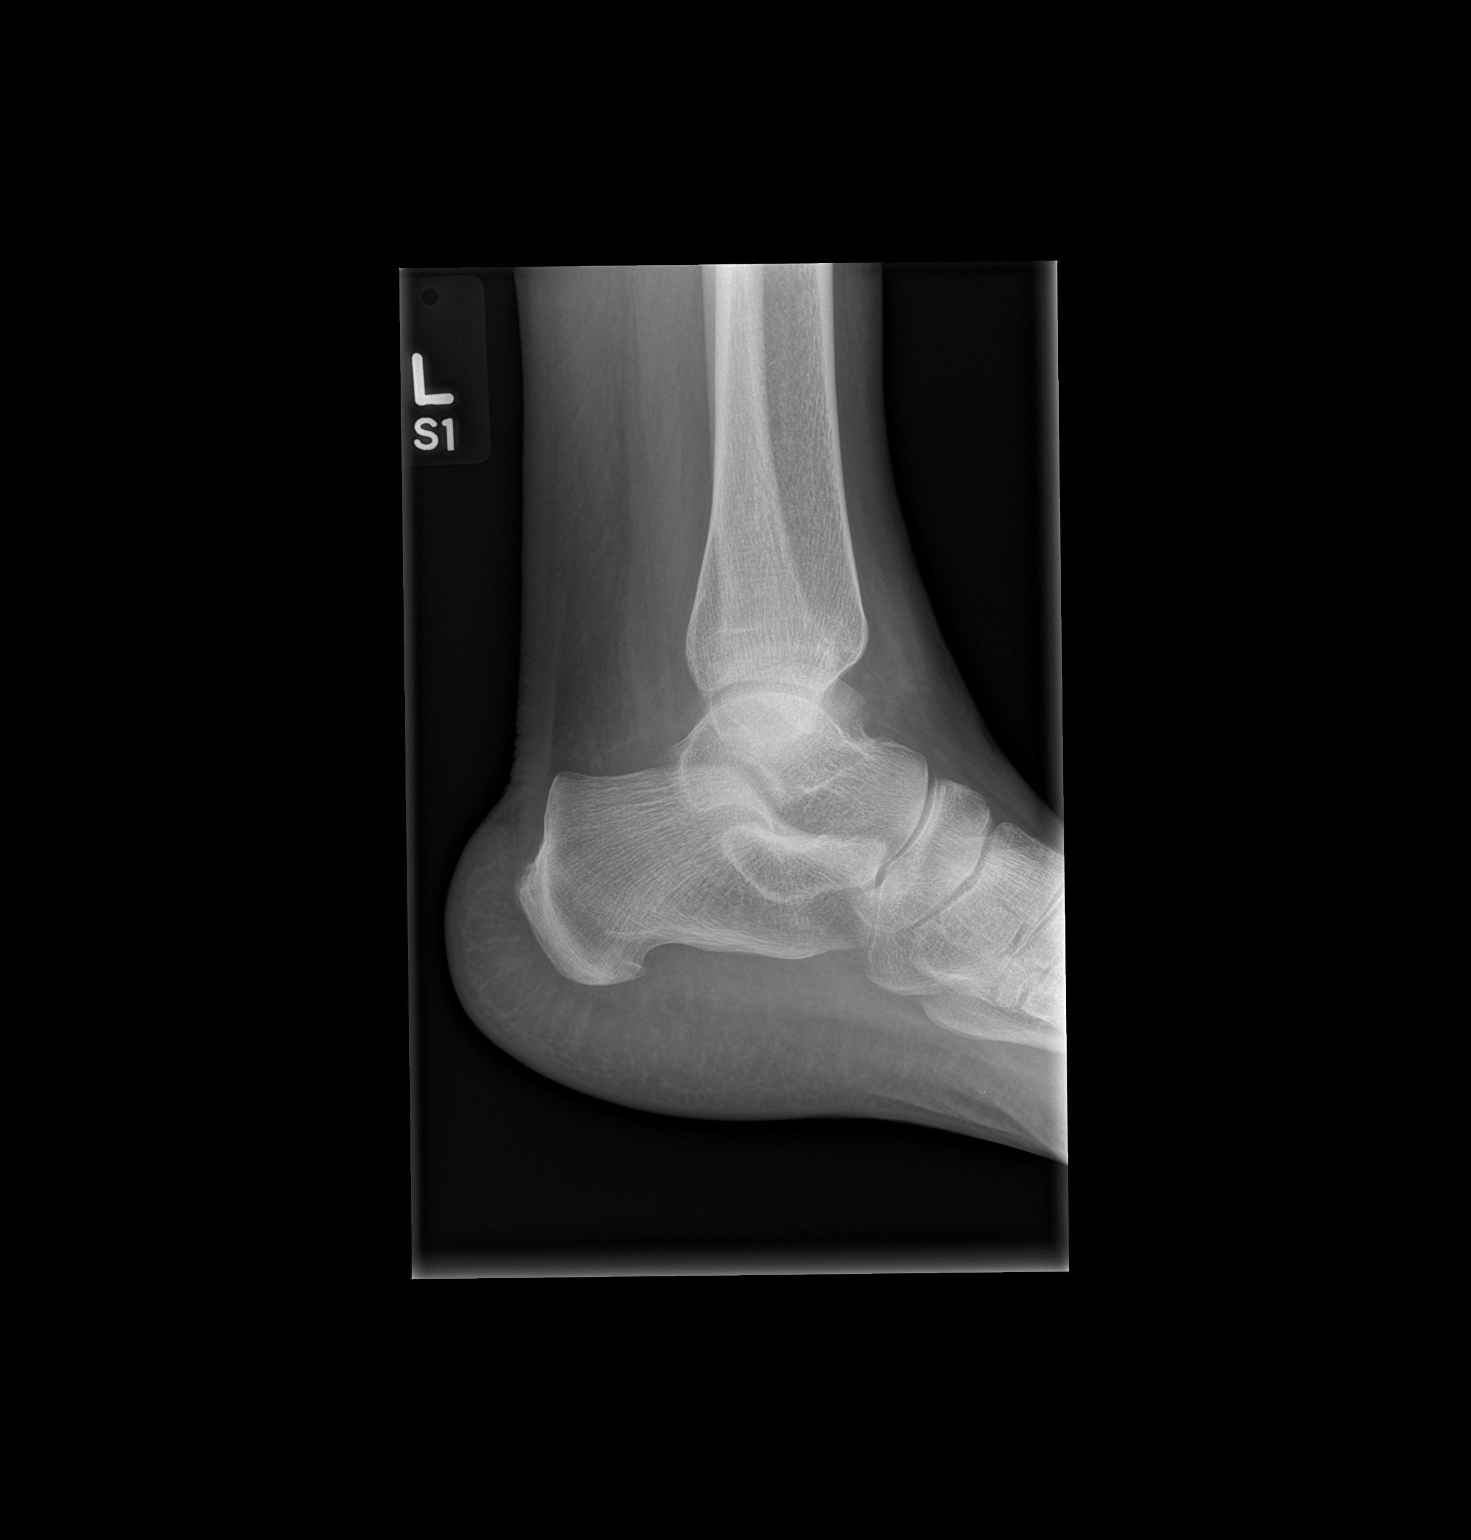

[3 of 3 positions shown; findings below may reference images not displayed]

FINDINGS: Three views of the left ankle were obtained.  The left
ankle is located.  No evidence for acute fracture. Concern for
lateral soft tissue swelling.  Mild spurring at the plantar aspect
of the calcaneus.
IMPRESSION: No acute bony abnormality.

Lateral soft tissue swelling.

## 2014-01-25 ENCOUNTER — Other Ambulatory Visit: Payer: Self-pay

## 2014-01-25 DIAGNOSIS — Z1231 Encounter for screening mammogram for malignant neoplasm of breast: Secondary | ICD-10-CM

## 2014-02-03 ENCOUNTER — Inpatient Hospital Stay: Admission: RE | Admit: 2014-02-03 | Payer: BC Managed Care – PPO | Source: Ambulatory Visit

## 2014-06-09 ENCOUNTER — Ambulatory Visit
Admission: RE | Admit: 2014-06-09 | Discharge: 2014-06-09 | Disposition: A | Discharge status: Home / Self Care | Payer: BC Managed Care – PPO | Source: Ambulatory Visit

## 2014-06-09 DIAGNOSIS — Z1231 Encounter for screening mammogram for malignant neoplasm of breast: Secondary | ICD-10-CM

## 2014-06-27 ENCOUNTER — Encounter (HOSPITAL_COMMUNITY): Payer: Self-pay | Admitting: Emergency Medicine

## 2015-07-10 ENCOUNTER — Other Ambulatory Visit: Payer: Self-pay

## 2015-07-10 DIAGNOSIS — Z1231 Encounter for screening mammogram for malignant neoplasm of breast: Secondary | ICD-10-CM

## 2015-07-28 ENCOUNTER — Ambulatory Visit
Admission: RE | Admit: 2015-07-28 | Discharge: 2015-07-28 | Disposition: A | Discharge status: Home / Self Care | Payer: BLUE CROSS/BLUE SHIELD | Source: Ambulatory Visit

## 2015-07-28 DIAGNOSIS — Z1231 Encounter for screening mammogram for malignant neoplasm of breast: Secondary | ICD-10-CM

## 2016-06-19 ENCOUNTER — Other Ambulatory Visit: Payer: Self-pay | Admitting: Internal Medicine

## 2016-06-19 DIAGNOSIS — Z1231 Encounter for screening mammogram for malignant neoplasm of breast: Secondary | ICD-10-CM

## 2016-07-30 ENCOUNTER — Ambulatory Visit
Admission: RE | Admit: 2016-07-30 | Discharge: 2016-07-30 | Disposition: A | Discharge status: Home / Self Care | Payer: BLUE CROSS/BLUE SHIELD | Source: Ambulatory Visit | Attending: Internal Medicine | Admitting: Internal Medicine

## 2016-07-30 DIAGNOSIS — Z1231 Encounter for screening mammogram for malignant neoplasm of breast: Secondary | ICD-10-CM

## 2017-06-16 ENCOUNTER — Encounter: Payer: Self-pay | Admitting: Obstetrics

## 2017-06-16 ENCOUNTER — Ambulatory Visit (INDEPENDENT_AMBULATORY_CARE_PROVIDER_SITE_OTHER): Payer: BLUE CROSS/BLUE SHIELD | Admitting: Obstetrics

## 2017-06-16 VITALS — BP 192/112 | HR 69 | Ht 71.0 in | Wt 234.8 lb

## 2017-06-16 DIAGNOSIS — Z780 Asymptomatic menopausal state: Secondary | ICD-10-CM

## 2017-06-16 DIAGNOSIS — R030 Elevated blood-pressure reading, without diagnosis of hypertension: Secondary | ICD-10-CM

## 2017-06-16 DIAGNOSIS — Z01419 Encounter for gynecological examination (general) (routine) without abnormal findings: Secondary | ICD-10-CM | POA: Diagnosis not present

## 2017-06-16 DIAGNOSIS — R102 Pelvic and perineal pain: Secondary | ICD-10-CM

## 2017-06-16 NOTE — Progress Notes (Signed)
Patient ID: Amy Schultz, female   DOB: July 18, 1956, 61 y.o.   MRN: [id number]  Chief Complaint  Patient presents with  . Gynecologic Exam    Pt c/o intermittent abdominal cramping, "like period cramps" x 2 weeks    HPI Amy Schultz is a 61 y.o. female.  Period-like cramps for past 2-3 weeks.  Has been menopausal for ~ 7 years.  Denies vaginal bleeding or discharge.  Denies constipation or diarrhea. HPI  Past Medical History:  Diagnosis Date  . Hypertension   . Thyroid disease     Past Surgical History:  Procedure Laterality Date  . THYROID SURGERY     didn't remove thyroid  . TUBAL LIGATION      Family History  Problem Relation Age of Onset  . Diabetes Mother   . Hypertension Mother   . Breast cancer Mother   . Hypertension Sister   . Breast cancer Sister   . Breast cancer Maternal Aunt   . Diabetes Maternal Grandmother   . Colon cancer Neg Hx   . Esophageal cancer Neg Hx   . Rectal cancer Neg Hx   . Stomach cancer Neg Hx     Social History Social History  Substance Use Topics  . Smoking status: Never Smoker  . Smokeless tobacco: Never Used  . Alcohol use No    Allergies  Allergen Reactions  . Codeine Nausea Only  . Darvocet [Propoxyphene N-Acetaminophen] Nausea Only  . Demerol Nausea Only  . Hydrocodone Nausea Only  . Penicillins Nausea Only    Current Outpatient Prescriptions  Medication Sig Dispense Refill  . naproxen sodium (ALEVE) 220 MG tablet Take 220 mg by mouth 2 (two) times daily with a meal.     No current facility-administered medications for this visit.     Review of Systems Review of Systems Constitutional: negative for fatigue and weight loss Respiratory: negative for cough and wheezing Cardiovascular: negative for chest pain, fatigue and palpitations Gastrointestinal: negative for abdominal pain and change in bowel habits Genitourinary:period-like cramps Integument/breast: negative for nipple discharge Musculoskeletal:negative for  myalgias Neurological: negative for gait problems and tremors Behavioral/Psych: negative for abusive relationship, depression Endocrine: negative for temperature intolerance      Blood pressure (!) 192/112, pulse 69, height 5\' 11"  (1.803 m), weight 234 lb 12.8 oz (106.5 kg).  Physical Exam Physical Exam General:   alert  Skin:   no rash or abnormalities  Lungs:   clear to auscultation bilaterally  Heart:   regular rate and rhythm, S1, S2 normal, no murmur, click, rub or gallop  Breasts:   normal without suspicious masses, skin or nipple changes or axillary nodes  Abdomen:  normal findings: no organomegaly, soft, non-tender and no hernia  Pelvis:  External genitalia: normal general appearance Urinary system: urethral meatus normal and bladder without fullness, nontender Vaginal: normal without tenderness, induration or masses Cervix: normal appearance Adnexa: normal bimanual exam Uterus: anteverted and non-tender, normal size    50% of 20 min visit spent on counseling and coordination of care.    Data Reviewed Wet prep Urinalysis  Assessment and Plan:  1. Encounter for gynecological examination with Papanicolaou smear of cervix Rx: -- Cytology - PAP  2. Pelvic pain in female Rx: - US PELVIC COMPLETE WITH TRANSVAGINAL; Future  3. Postmenopausal - doing well  4. Elevated BP without diagnosis of hypertension - will follow up with PCP        Plan    Call patient with ultrasound results Follow  up in 1 year for Annual.  Orders Placed This Encounter  Procedures  . US PELVIC COMPLETE WITH TRANSVAGINAL    Standing Status:   Future    Standing Expiration Date:   08/16/2018    Order Specific Question:   Reason for Exam (SYMPTOM  OR DIAGNOSIS REQUIRED)    Answer:   Pelvic pain    Order Specific Question:   Preferred imaging location?    Answer:   [facility]   Meds ordered this encounter  Medications  . naproxen sodium (ALEVE) 220 MG tablet    Sig: Take 220 mg  by mouth 2 (two) times daily with a meal.

## 2017-06-17 ENCOUNTER — Ambulatory Visit
Admission: RE | Admit: 2017-06-17 | Discharge: 2017-06-17 | Disposition: A | Discharge status: Home / Self Care | Payer: BLUE CROSS/BLUE SHIELD | Source: Ambulatory Visit | Attending: Obstetrics | Admitting: Obstetrics

## 2017-06-17 DIAGNOSIS — R102 Pelvic and perineal pain: Secondary | ICD-10-CM

## 2017-06-18 LAB — CYTOLOGY - PAP
Diagnosis: NEGATIVE
HPV (WINDOPATH): NOT DETECTED

## 2017-06-19 ENCOUNTER — Other Ambulatory Visit: Payer: Self-pay | Admitting: Obstetrics

## 2017-06-19 DIAGNOSIS — N838 Other noninflammatory disorders of ovary, fallopian tube and broad ligament: Secondary | ICD-10-CM

## 2017-07-02 ENCOUNTER — Other Ambulatory Visit: Payer: Self-pay | Admitting: Obstetrics

## 2017-07-02 DIAGNOSIS — R1900 Intra-abdominal and pelvic swelling, mass and lump, unspecified site: Secondary | ICD-10-CM

## 2017-08-07 ENCOUNTER — Other Ambulatory Visit: Payer: Self-pay | Admitting: Internal Medicine

## 2017-08-07 DIAGNOSIS — Z1231 Encounter for screening mammogram for malignant neoplasm of breast: Secondary | ICD-10-CM

## 2017-08-12 ENCOUNTER — Ambulatory Visit
Admission: RE | Admit: 2017-08-12 | Discharge: 2017-08-12 | Disposition: A | Discharge status: Home / Self Care | Payer: BLUE CROSS/BLUE SHIELD | Source: Ambulatory Visit | Attending: Obstetrics | Admitting: Obstetrics

## 2017-08-12 DIAGNOSIS — N838 Other noninflammatory disorders of ovary, fallopian tube and broad ligament: Secondary | ICD-10-CM

## 2017-08-13 ENCOUNTER — Other Ambulatory Visit: Payer: Self-pay | Admitting: Obstetrics

## 2017-08-13 DIAGNOSIS — R102 Pelvic and perineal pain: Secondary | ICD-10-CM

## 2017-08-13 NOTE — Progress Notes (Signed)
Please sch

## 2017-08-21 ENCOUNTER — Ambulatory Visit
Admission: RE | Admit: 2017-08-21 | Discharge: 2017-08-21 | Disposition: A | Discharge status: Home / Self Care | Payer: BLUE CROSS/BLUE SHIELD | Source: Ambulatory Visit | Attending: Obstetrics | Admitting: Obstetrics

## 2017-08-21 DIAGNOSIS — R102 Pelvic and perineal pain: Secondary | ICD-10-CM

## 2017-08-21 MED ORDER — IOPAMIDOL (ISOVUE-300) INJECTION 61%
100.0000 mL | Freq: Once | INTRAVENOUS | Status: AC | PRN
  Administered 2017-08-21: 100 mL via INTRAVENOUS

## 2017-08-22 ENCOUNTER — Ambulatory Visit: Payer: BLUE CROSS/BLUE SHIELD | Admitting: Obstetrics

## 2017-09-01 ENCOUNTER — Telehealth: Payer: Self-pay

## 2017-09-01 NOTE — Telephone Encounter (Signed)
Pt called and left message wanting to know results of scan done at [facility] done on 08/12/17. Please review results

## 2017-09-01 NOTE — Telephone Encounter (Signed)
Pt informed

## 2017-09-01 NOTE — Telephone Encounter (Signed)
CT Scan and Ultrasound are normal.

## 2017-09-05 ENCOUNTER — Ambulatory Visit
Admission: RE | Admit: 2017-09-05 | Discharge: 2017-09-05 | Disposition: A | Discharge status: Home / Self Care | Payer: BLUE CROSS/BLUE SHIELD | Source: Ambulatory Visit | Attending: Internal Medicine | Admitting: Internal Medicine

## 2017-09-05 DIAGNOSIS — Z1231 Encounter for screening mammogram for malignant neoplasm of breast: Secondary | ICD-10-CM

## 2017-09-10 ENCOUNTER — Telehealth: Payer: Self-pay

## 2017-09-10 ENCOUNTER — Encounter: Payer: Self-pay | Admitting: Gastroenterology

## 2017-09-10 ENCOUNTER — Other Ambulatory Visit: Payer: Self-pay | Admitting: Obstetrics

## 2017-09-10 DIAGNOSIS — R1030 Lower abdominal pain, unspecified: Secondary | ICD-10-CM

## 2017-09-10 NOTE — Telephone Encounter (Signed)
Patient called about her CT Scan and wants to be referred to a "Specialist to fix her pain". Mess sent to Dr. Jodi Mourning.

## 2017-09-30 ENCOUNTER — Encounter: Payer: Self-pay | Admitting: Gastroenterology

## 2017-09-30 ENCOUNTER — Ambulatory Visit: Payer: BLUE CROSS/BLUE SHIELD | Admitting: Gastroenterology

## 2017-09-30 ENCOUNTER — Encounter (INDEPENDENT_AMBULATORY_CARE_PROVIDER_SITE_OTHER): Payer: Self-pay

## 2017-09-30 VITALS — BP 152/90 | HR 76 | Ht 70.0 in | Wt 236.5 lb

## 2017-09-30 DIAGNOSIS — R1030 Lower abdominal pain, unspecified: Secondary | ICD-10-CM

## 2017-09-30 MED ORDER — HYOSCYAMINE SULFATE 0.125 MG SL SUBL: SUBLINGUAL_TABLET | SUBLINGUAL | 3 refills | Status: DC

## 2017-09-30 NOTE — Patient Instructions (Addendum)
Levsin sl 0.125mg  SL tab, take one to 2 tabs every 8 hours PRN.  Call in 4-5 weeks to report on your response. [phone] ask for Katina Degree RN  May need repeat colonoscopy if the antispasm meds are not helpful.  Thank you for choosing [facility]

## 2017-09-30 NOTE — Progress Notes (Signed)
HPI: This is a  very pleasant 62 year old woman who was referred to me by Shelly Bombard, MD  to evaluate lower abdominal pains.    Chief complaint is lower abdominal pains  For the past 2 or 3 months she has been bothered by lower abdominal pains.  They are crampy pains.  They occur 2 or 3 times a week and last for a few minutes.  Eating does not tend to bring them on.  Moving her bowels does not tend to bring them on or improve them.  No particular body positions tend to bring on the pains or alleviate them.  pain does not radiate.  She has no associated nausea or vomiting with the pains.  She says it feels like she may need to move her bowels or may be starting her menstrual cycle.  She has had no real change in her fairly regular except for mild intermittent constipation for which she takes lactulose with very good relief.  She has no blood in her stool.  Her weight has been overall stable.  She has had testing for the pain including CT scan and pelvic ultrasound.  See below  Old Data Reviewed: CT scan pelvis with IV contrast December 2018; essentially normal, specifically mention there was no adnexal mass.  Pelvic US 07/2017: Persistent echogenic area within the right adnexa with posterior acoustic shadowing. This could reflect colon with stool, but the persistence raises the possibility of a calcified adnexal mass. Recommend further evaluation with CT with oral and IV contrast for further evaluation.  Colonoscopy Dr. Sharlett Iles 12/2011 for screening: normal, no polyps. He recommended recall screening at 10 year interval.   Review of systems: Pertinent positive and negative review of systems were noted in the above HPI section. All other review negative.   Past Medical History:  Diagnosis Date  . Hypertension   . Thyroid disease     Past Surgical History:  Procedure Laterality Date  . THYROID SURGERY     didn't remove thyroid  . TUBAL LIGATION      Current Outpatient  Medications  Medication Sig Dispense Refill  . naproxen sodium (ALEVE) 220 MG tablet Take 220 mg by mouth as needed.     Marland Kitchen olmesartan-hydrochlorothiazide (BENICAR HCT) 20-12.5 MG tablet Take 1 tablet by mouth daily.     No current facility-administered medications for this visit.     Allergies as of 09/30/2017 - Review Complete 09/30/2017  Allergen Reaction Noted  . Codeine Nausea Only 01/29/2011  . Darvocet [propoxyphene n-acetaminophen] Nausea Only 09/13/2011  . Demerol Nausea Only 01/29/2011  . Hydrocodone Nausea Only 11/21/2012  . Penicillins Nausea Only 01/29/2011    Family History  Problem Relation Age of Onset  . Diabetes Mother   . Hypertension Mother   . Hypertension Sister   . Breast cancer Sister        unsure age of onset  . Breast cancer Maternal Aunt        unsure age of onset  . Diabetes Maternal Grandmother   . Colon cancer Neg Hx   . Esophageal cancer Neg Hx   . Rectal cancer Neg Hx   . Stomach cancer Neg Hx     Social History   Socioeconomic History  . Marital status: Single    Spouse name: Not on file  . Number of children: 3  . Years of education: Not on file  . Highest education level: Not on file  Social Needs  . Emergency planning/management officer  strain: Not on file  . Food insecurity - worry: Not on file  . Food insecurity - inability: Not on file  . Transportation needs - medical: Not on file  . Transportation needs - non-medical: Not on file  Occupational History  . Not on file  Tobacco Use  . Smoking status: Never Smoker  . Smokeless tobacco: Never Used  Substance and Sexual Activity  . Alcohol use: No  . Drug use: No  . Sexual activity: Not Currently    Birth control/protection: Surgical  Other Topics Concern  . Not on file  Social History Narrative  . Not on file     Physical Exam: Ht 5\' 10"  (1.778 m)   Wt 236 lb 8 oz (107.3 kg)   BMI 33.93 kg/m  Constitutional: generally well-appearing Psychiatric: alert and oriented x3 Eyes:  extraocular movements intact Mouth: oral pharynx moist, no lesions Neck: supple no lymphadenopathy Cardiovascular: heart regular rate and rhythm Lungs: clear to auscultation bilaterally Abdomen: soft, nontender, nondistended, no obvious ascites, no peritoneal signs, normal bowel sounds Extremities: no lower extremity edema bilaterally Skin: no lesions on visible extremities   Assessment and plan: 62 y.o. female with intermittent lower abdominal pains  These are of very unclear etiology.  They do not necessarily seem to be GI related.  She was essentially cleared by her gynecologist as well.  That she has no real alarm symptoms associated with this.  I recommended a trial of oral sublingual antispasm medicines to see if that helps.  If it does not then the next step in terms of GI testing would be a colonoscopy to see if something has not significantly changed in her colon since her last one about 6 years ago.    Please see the "Patient Instructions" section for addition details about the plan.   Owens Loffler, MD [facility] 09/30/2017, 3:07 PM  Cc: Shelly Bombard, MD

## 2017-11-24 ENCOUNTER — Telehealth: Payer: Self-pay | Admitting: Gastroenterology

## 2017-11-24 NOTE — Telephone Encounter (Signed)
Dr Ardis Hughs the pt states she is continuing to have abd pain.  She wants to know if she can schedule colon now.    Per your note.  Levsin sl 0.125mg  SL tab, take one to 2 tabs every 8 hours PRN.  Call in 4-5 weeks to report on your response. [phone] ask for Katina Degree RN  May need repeat colonoscopy if the antispasm meds are not helpful.  Thank you for choosing [facility]  Please advise.

## 2017-11-25 NOTE — Telephone Encounter (Signed)
Left message on machine to call back  

## 2017-11-25 NOTE — Telephone Encounter (Signed)
Yes, colonoscopy per plan, still with abdominal pains.  Thanks

## 2017-11-26 NOTE — Telephone Encounter (Signed)
The pt has been advised and scheduled for pre visit and colon  

## 2017-11-26 NOTE — Telephone Encounter (Signed)
Left message on machine to call back  

## 2017-11-28 ENCOUNTER — Other Ambulatory Visit: Payer: Self-pay

## 2017-11-28 ENCOUNTER — Ambulatory Visit (AMBULATORY_SURGERY_CENTER): Payer: Self-pay | Admitting: *Deleted

## 2017-11-28 VITALS — Ht 71.0 in | Wt 245.2 lb

## 2017-11-28 DIAGNOSIS — R1030 Lower abdominal pain, unspecified: Secondary | ICD-10-CM

## 2017-11-28 NOTE — Progress Notes (Signed)
No egg or soy allergy known to patient  No issues with past sedation with any surgeries  or procedures, no intubation problems  No diet pills per patient No home 02 use per patient  No blood thinners per patient  Pt denies issues with constipation pt. States Bowels does not move everyday.  Takes otc meds for this.   No A fib or A flutter  EMMI video sent to pt's e mail

## 2017-12-05 ENCOUNTER — Telehealth: Payer: Self-pay | Admitting: Gastroenterology

## 2017-12-05 MED ORDER — NA SULFATE-K SULFATE-MG SULF 17.5-3.13-1.6 GM/177ML PO SOLN: 1.0000 | Freq: Once | ORAL | 0 refills | Status: AC

## 2017-12-05 NOTE — Telephone Encounter (Signed)
At [facility] pt was instructed on Suprep- resent in [place] as instructed-  Called  Pt and made her aware   Lelan Pons PV

## 2017-12-09 ENCOUNTER — Encounter: Payer: Self-pay | Admitting: Gastroenterology

## 2017-12-09 ENCOUNTER — Ambulatory Visit (AMBULATORY_SURGERY_CENTER): Payer: BLUE CROSS/BLUE SHIELD | Admitting: Gastroenterology

## 2017-12-09 ENCOUNTER — Other Ambulatory Visit: Payer: Self-pay

## 2017-12-09 VITALS — BP 149/76 | HR 51 | Temp 98.9°F | Resp 14 | Ht 71.0 in | Wt 245.0 lb

## 2017-12-09 DIAGNOSIS — R1030 Lower abdominal pain, unspecified: Secondary | ICD-10-CM | POA: Diagnosis present

## 2017-12-09 DIAGNOSIS — D123 Benign neoplasm of transverse colon: Secondary | ICD-10-CM | POA: Diagnosis not present

## 2017-12-09 MED ORDER — SODIUM CHLORIDE 0.9 % IV SOLN: 500.0000 mL | Freq: Once | INTRAVENOUS | Status: DC

## 2017-12-09 NOTE — Progress Notes (Signed)
Spontaneous respirations throughout. VSS. Resting comfortably. To PACU on room air. Report to  RN. 

## 2017-12-09 NOTE — Op Note (Signed)
[facility] Patient Name: Amy Schultz Procedure Date: 12/09/2017 8:52 AM MRN: [id number] Endoscopist: Milus Banister , MD Age: 62 Referring MD:  Date of Birth: Mar 02, 1956 Gender: Female Account #: [id number] Procedure:                Colonoscopy Indications:              Lower abdominal pain Medicines:                Monitored Anesthesia Care Procedure:                Pre-Anesthesia Assessment:                           - Prior to the procedure, a History and Physical                            was performed, and patient medications and                            allergies were reviewed. The patient's tolerance of                            previous anesthesia was also reviewed. The risks                            and benefits of the procedure and the sedation                            options and risks were discussed with the patient.                            All questions were answered, and informed consent                            was obtained. Prior Anticoagulants: The patient has                            taken no previous anticoagulant or antiplatelet                            agents. ASA Grade Assessment: II - A patient with                            mild systemic disease. After reviewing the risks                            and benefits, the patient was deemed in                            satisfactory condition to undergo the procedure.                           After obtaining informed consent, the colonoscope  was passed under direct vision. Throughout the                            procedure, the patient's blood pressure, pulse, and                            oxygen saturations were monitored continuously. The                            Colonoscope was introduced through the anus and                            advanced to the the cecum, identified by                            appendiceal orifice and ileocecal valve. The                       colonoscopy was performed without difficulty. The                            patient tolerated the procedure well. The quality                            of the bowel preparation was good. The ileocecal                            valve, appendiceal orifice, and rectum were                            photographed. Scope In: 8:57:37 AM Scope Out: 9:09:44 AM Scope Withdrawal Time: 0 hours 8 minutes 53 seconds  Total Procedure Duration: 0 hours 12 minutes 7 seconds  Findings:                 Two sessile polyps were found in the transverse                            colon. The polyps were 2 to 3 mm in size. These                            polyps were removed with a cold snare. Resection                            and retrieval were complete.                           The exam was otherwise without abnormality on                            direct and retroflexion views. Complications:            No immediate complications. Estimated Blood Loss:     Estimated blood loss: none. Impression:               - Two 2 to 3  mm polyps in the transverse colon,                            removed with a cold snare. Resected and retrieved.                           - The examination was otherwise normal on direct                            and retroflexion views. Recommendation:           - Patient has a contact number available for                            emergencies. The signs and symptoms of potential                            delayed complications were discussed with the                            patient. Return to normal activities tomorrow.                            Written discharge instructions were provided to the                            patient.                           - Resume previous diet.                           - Continue present medications. Please start once                            daily OTC fiber supplement (such as powdered                             citrucel), this may help your mild constipation                            which may in turn help your lower abdominal pains.                           You will receive a letter within 2-3 weeks with the                            pathology results and my final recommendations.                           If the polyp(s) is proven to be 'pre-cancerous' on                            pathology, you will need repeat colonoscopy in 5  years. If the polyp(s) is NOT 'precancerous' on                            pathology then you should repeat colon cancer                            screening in 10 years with colonoscopy without need                            for colon cancer screening by any method prior to                            then (including stool testing). Milus Banister, MD 12/09/2017 9:15:28 AM This report has been signed electronically.

## 2017-12-09 NOTE — Progress Notes (Signed)
I have reviewed the patient's medical history in detail and updated the computerized patient record.

## 2017-12-09 NOTE — Patient Instructions (Signed)
HANDOUTS GIVEN FOR POLYPS.  PLEASE START DAILY OVER THE COUNTER FIBER SUPPLEMENT LIKE (CITRUCEL POWDER) WHICH MAY HELP YOUR MILD CONSTIPATION AND HELP WITH LOWER ABDOMINAL PAINS.  YOU HAD AN ENDOSCOPIC PROCEDURE TODAY AT [facility]:   Refer to the procedure report that was given to you for any specific questions about what was found during the examination.  If the procedure report does not answer your questions, please call your gastroenterologist to clarify.  If you requested that your care partner not be given the details of your procedure findings, then the procedure report has been included in a sealed envelope for you to review at your convenience later.  YOU SHOULD EXPECT: Some feelings of bloating in the abdomen. Passage of more gas than usual.  Walking can help get rid of the air that was put into your GI tract during the procedure and reduce the bloating. If you had a lower endoscopy (such as a colonoscopy or flexible sigmoidoscopy) you may notice spotting of blood in your stool or on the toilet paper. If you underwent a bowel prep for your procedure, you may not have a normal bowel movement for a few days.  Please Note:  You might notice some irritation and congestion in your nose or some drainage.  This is from the oxygen used during your procedure.  There is no need for concern and it should clear up in a day or so.  SYMPTOMS TO REPORT IMMEDIATELY:   Following lower endoscopy (colonoscopy or flexible sigmoidoscopy):  Excessive amounts of blood in the stool  Significant tenderness or worsening of abdominal pains  Swelling of the abdomen that is new, acute  Fever of 100F or higher  For urgent or emergent issues, a gastroenterologist can be reached at any hour by calling [phone].   DIET:  We do recommend a small meal at first, but then you may proceed to your regular diet.  Drink plenty of fluids but you should avoid alcoholic beverages for 24  hours.  ACTIVITY:  You should plan to take it easy for the rest of today and you should NOT DRIVE or use heavy machinery until tomorrow (because of the sedation medicines used during the test).    FOLLOW UP: Our staff will call the number listed on your records the next business day following your procedure to check on you and address any questions or concerns that you may have regarding the information given to you following your procedure. If we do not reach you, we will leave a message.  However, if you are feeling well and you are not experiencing any problems, there is no need to return our call.  We will assume that you have returned to your regular daily activities without incident.  If any biopsies were taken you will be contacted by phone or by letter within the next 1-3 weeks.  Please call us at [phone] if you have not heard about the biopsies in 3 weeks.    SIGNATURES/CONFIDENTIALITY: You and/or your care partner have signed paperwork which will be entered into your electronic medical record.  These signatures attest to the fact that that the information above on your After Visit Summary has been reviewed and is understood.  Full responsibility of the confidentiality of this discharge information lies with you and/or your care-partner.

## 2017-12-09 NOTE — Progress Notes (Signed)
Called to room to assist during endoscopic procedure.  Patient ID and intended procedure confirmed with present staff. Received instructions for my participation in the procedure from the performing physician.  

## 2017-12-10 ENCOUNTER — Telehealth: Payer: Self-pay | Admitting: *Deleted

## 2017-12-10 ENCOUNTER — Telehealth: Payer: Self-pay

## 2017-12-10 NOTE — Telephone Encounter (Signed)
Called [phone] and left a messaged we tried to reach pt for a follow up call. maw

## 2017-12-10 NOTE — Telephone Encounter (Signed)
No answer. Number identifier. Message left to call if questions or concerns. 

## 2017-12-16 ENCOUNTER — Encounter: Payer: Self-pay | Admitting: Gastroenterology

## 2018-09-02 ENCOUNTER — Other Ambulatory Visit: Payer: Self-pay | Admitting: Internal Medicine

## 2018-09-02 DIAGNOSIS — Z1231 Encounter for screening mammogram for malignant neoplasm of breast: Secondary | ICD-10-CM

## 2018-10-07 ENCOUNTER — Telehealth (HOSPITAL_COMMUNITY): Payer: Self-pay

## 2018-10-07 NOTE — Telephone Encounter (Signed)
Returned patient's phone call. Left name and number for her to call back. °

## 2018-12-01 ENCOUNTER — Ambulatory Visit: Payer: BLUE CROSS/BLUE SHIELD

## 2019-01-19 ENCOUNTER — Ambulatory Visit
Admission: RE | Admit: 2019-01-19 | Discharge: 2019-01-19 | Disposition: A | Discharge status: Home / Self Care | Payer: No Typology Code available for payment source | Source: Ambulatory Visit | Attending: Internal Medicine | Admitting: Internal Medicine

## 2019-01-19 ENCOUNTER — Other Ambulatory Visit: Payer: Self-pay

## 2019-01-19 DIAGNOSIS — Z1231 Encounter for screening mammogram for malignant neoplasm of breast: Secondary | ICD-10-CM

## 2019-02-08 ENCOUNTER — Ambulatory Visit
Admission: EM | Admit: 2019-02-08 | Discharge: 2019-02-08 | Disposition: A | Discharge status: Home / Self Care | Payer: No Typology Code available for payment source | Attending: Emergency Medicine | Admitting: Emergency Medicine

## 2019-02-08 ENCOUNTER — Other Ambulatory Visit: Payer: Self-pay

## 2019-02-08 DIAGNOSIS — R51 Headache: Secondary | ICD-10-CM | POA: Diagnosis not present

## 2019-02-08 DIAGNOSIS — M79604 Pain in right leg: Secondary | ICD-10-CM | POA: Diagnosis not present

## 2019-02-08 DIAGNOSIS — R519 Headache, unspecified: Secondary | ICD-10-CM

## 2019-02-08 DIAGNOSIS — M542 Cervicalgia: Secondary | ICD-10-CM

## 2019-02-08 DIAGNOSIS — R030 Elevated blood-pressure reading, without diagnosis of hypertension: Secondary | ICD-10-CM

## 2019-02-08 MED ORDER — MELOXICAM 7.5 MG PO TABS: 7.5000 mg | ORAL_TABLET | Freq: Every day | ORAL | 0 refills | Status: AC

## 2019-02-08 MED ORDER — CYCLOBENZAPRINE HCL 5 MG PO TABS: 5.0000 mg | ORAL_TABLET | Freq: Two times a day (BID) | ORAL | 0 refills | Status: DC | PRN
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
## 2019-02-08 NOTE — Discharge Instructions (Signed)
°  Meloxicam (Mobic) is an antiinflammatory to help with pain and inflammation.  Do not take ibuprofen, Advil, Aleve, or any other medications that contain NSAIDs while taking meloxicam as this may cause stomach upset or even ulcers if taken in large amounts for an extended period of time.  You may take this medication 1 hour before work but it should last 24 hours.  You may take 1 tab twice daily or 2 at the same time. Do not take more than 2 tabs in 24 hours.  Flexeril (cyclobenzaprine) is a muscle relaxer and may cause drowsiness. Do not drink alcohol, drive, or operate heavy machinery while taking.  Your blood pressure was elevated today. Please be sure to monitor at home. Follow up with family medicine in 1 week if pain not and improving and for ongoing healthcare needs including blood pressure management.

## 2019-08-04 ENCOUNTER — Ambulatory Visit (INDEPENDENT_AMBULATORY_CARE_PROVIDER_SITE_OTHER): Payer: No Typology Code available for payment source | Admitting: Nurse Practitioner

## 2019-08-04 DIAGNOSIS — I10 Essential (primary) hypertension: Secondary | ICD-10-CM

## 2019-08-04 DIAGNOSIS — Z8639 Personal history of other endocrine, nutritional and metabolic disease: Secondary | ICD-10-CM

## 2019-08-04 DIAGNOSIS — Z7689 Persons encountering health services in other specified circumstances: Secondary | ICD-10-CM

## 2019-08-04 DIAGNOSIS — Z13220 Encounter for screening for lipoid disorders: Secondary | ICD-10-CM

## 2019-08-04 DIAGNOSIS — Z131 Encounter for screening for diabetes mellitus: Secondary | ICD-10-CM

## 2019-08-04 DIAGNOSIS — Z130 Encounter for screening for diseases of the blood and blood-forming organs and certain disorders involving the immune mechanism: Secondary | ICD-10-CM

## 2019-08-04 MED ORDER — AMLODIPINE BESYLATE 10 MG PO TABS: 10.0000 mg | ORAL_TABLET | Freq: Every day | ORAL | 3 refills | Status: DC

## 2019-08-04 NOTE — Progress Notes (Signed)
Virtual Visit via Telephone Note Due to national recommendations of social distancing due to [place] 19, telehealth visit is felt to be most appropriate for this patient at this time.  I discussed the limitations, risks, security and privacy concerns of performing an evaluation and management service by telephone and the availability of in person appointments. I also discussed with the patient that there may be a patient responsible charge related to this service. The patient expressed understanding and agreed to proceed.    I connected with Amy Schultz on 08/04/19  at   8:50 AM EST  EDT by telephone and verified that I am speaking with the correct person using two identifiers.   Consent I discussed the limitations, risks, security and privacy concerns of performing an evaluation and management service by telephone and the availability of in person appointments. I also discussed with the patient that there may be a patient responsible charge related to this service. The patient expressed understanding and agreed to proceed.   Location of Patient: Private  Residence   Location of Provider: [place] and [employer] Office    Persons participating in Telemedicine visit: Geryl Rankins FNP-BC [place]    History of Present Illness: Telemedicine visit for: Establish Care   Essential Hypertension Stopped taking her blood pressure medication (HYZAAR 50-12.5 mg) 3 months ago.  States "it wasn't helping". However she was not monitoring her blood pressure at that time. According to Amy Schultz she knew her blood pressure was up and the medication was not helping because she was dizzy. Denies chest pain, shortness of breath, palpitations, lightheadedness, dizziness, headaches or BLE edema.  Will start amlodipine 10 mg. Return in 2 weeks for BP recheck. Will add losartan if blood pressure still elevated.  BP Readings from Last 3 Encounters:  02/08/19 (!) 195/110  12/09/17  (!) 149/76  09/30/17 (!) 152/90      Past Medical History:  Diagnosis Date  . Hypertension   . Thyroid disease     Past Surgical History:  Procedure Laterality Date  . COLONOSCOPY    . THYROID SURGERY     didn't remove thyroid  . TUBAL LIGATION      Family History  Problem Relation Age of Onset  . Diabetes Mother   . Hypertension Mother   . Hypertension Sister   . Breast cancer Sister        unsure age of onset  . Breast cancer Maternal Aunt        unsure age of onset  . Diabetes Maternal Grandmother   . Colon cancer Neg Hx   . Esophageal cancer Neg Hx   . Rectal cancer Neg Hx   . Stomach cancer Neg Hx   . Colon polyps Neg Hx     Social History   Socioeconomic History  . Marital status: Single    Spouse name: Not on file  . Number of children: 3  . Years of education: Not on file  . Highest education level: Not on file  Occupational History  . Not on file  Social Needs  . Financial resource strain: Not on file  . Food insecurity    Worry: Not on file    Inability: Not on file  . Transportation needs    Medical: Not on file    Non-medical: Not on file  Tobacco Use  . Smoking status: Never Smoker  . Smokeless tobacco: Never Used  Substance and Sexual Activity  . Alcohol use:  No  . Drug use: No  . Sexual activity: Not Currently    Birth control/protection: Surgical  Lifestyle  . Physical activity    Days per week: Not on file    Minutes per session: Not on file  . Stress: Not on file  Relationships  . Social Herbalist on phone: Not on file    Gets together: Not on file    Attends religious service: Not on file    Active member of club or organization: Not on file    Attends meetings of clubs or organizations: Not on file    Relationship status: Not on file  Other Topics Concern  . Not on file  Social History Narrative  . Not on file     Observations/Objective: Awake, alert and oriented x 3   Review of Systems  Constitutional:  Negative for fever, malaise/fatigue and weight loss.  HENT: Negative.  Negative for nosebleeds.   Eyes: Negative.  Negative for blurred vision, double vision and photophobia.  Respiratory: Negative.  Negative for cough and shortness of breath.   Cardiovascular: Negative.  Negative for chest pain, palpitations and leg swelling.  Gastrointestinal: Negative.  Negative for heartburn, nausea and vomiting.  Musculoskeletal: Negative.  Negative for myalgias.  Neurological: Negative.  Negative for dizziness, focal weakness, seizures and headaches.  Psychiatric/Behavioral: Negative.  Negative for suicidal ideas.    Assessment and Plan: Amy Schultz was seen today for establish care and hypertension.  Encounter to establish care  Essential hypertension -     amLODipine (NORVASC) 10 MG tablet; Take 1 tablet (10 mg total) by mouth daily. -     CMP14+EGFR; Future Continue all antihypertensives as prescribed.  Remember to bring in your blood pressure log with you for your follow up appointment.  DASH/Mediterranean Diets are healthier choices for HTN.   Screening for deficiency anemia -     CBC; Future  Lipid screening -     Lipid Panel; Future  History of thyroid disorder -     TSH; Future  Encounter for screening for diabetes mellitus -     A1c; Future     Follow Up Instructions Return in about 2 weeks (around 08/18/2019) for BP recheck and labs.     I discussed the assessment and treatment plan with the patient. The patient was provided an opportunity to ask questions and all were answered. The patient agreed with the plan and demonstrated an understanding of the instructions.   The patient was advised to call back or seek an in-person evaluation if the symptoms worsen or if the condition fails to improve as anticipated.  I provided 19 minutes of non-face-to-face time during this encounter including median intraservice time, reviewing previous notes, labs, imaging, medications and  explaining diagnosis and management.  Gildardo Pounds, FNP-BC

## 2019-08-04 NOTE — Progress Notes (Signed)
New patient appointment.  States that she stopped taking the Losartan-HCTZ b/c it wasn't helping her BP. Doesn't check BP at home. Denies chest pain, SHOB, headaches, palpitations, dizziness.

## 2019-08-17 ENCOUNTER — Other Ambulatory Visit: Payer: Self-pay

## 2019-08-17 ENCOUNTER — Ambulatory Visit (INDEPENDENT_AMBULATORY_CARE_PROVIDER_SITE_OTHER): Payer: No Typology Code available for payment source

## 2019-08-17 VITALS — BP 145/85 | HR 71 | Resp 17

## 2019-08-17 DIAGNOSIS — Z130 Encounter for screening for diseases of the blood and blood-forming organs and certain disorders involving the immune mechanism: Secondary | ICD-10-CM

## 2019-08-17 DIAGNOSIS — Z114 Encounter for screening for human immunodeficiency virus [HIV]: Secondary | ICD-10-CM

## 2019-08-17 DIAGNOSIS — Z1159 Encounter for screening for other viral diseases: Secondary | ICD-10-CM

## 2019-08-17 DIAGNOSIS — Z0131 Encounter for examination of blood pressure with abnormal findings: Secondary | ICD-10-CM | POA: Diagnosis not present

## 2019-08-17 DIAGNOSIS — Z131 Encounter for screening for diabetes mellitus: Secondary | ICD-10-CM

## 2019-08-17 DIAGNOSIS — I10 Essential (primary) hypertension: Secondary | ICD-10-CM | POA: Diagnosis not present

## 2019-08-17 DIAGNOSIS — Z8639 Personal history of other endocrine, nutritional and metabolic disease: Secondary | ICD-10-CM

## 2019-08-17 DIAGNOSIS — Z13220 Encounter for screening for lipoid disorders: Secondary | ICD-10-CM

## 2019-08-17 NOTE — Progress Notes (Signed)
Patient here for BP check & fasting labs. After letting patient sit BP was 145/85, pulse was 71. Advised patient to continue medication & we would make a follow up appointment based on lab results. KWalker, CMA.

## 2019-08-18 LAB — CMP14+EGFR
ALT: 16 IU/L (ref 0–32)
AST: 16 IU/L (ref 0–40)
Albumin/Globulin Ratio: 1.5 (ref 1.2–2.2)
Albumin: 4.4 g/dL (ref 3.8–4.8)
Alkaline Phosphatase: 81 IU/L (ref 39–117)
BUN/Creatinine Ratio: 16 (ref 12–28)
BUN: 14 mg/dL (ref 8–27)
Bilirubin Total: <0.2 mg/dL (ref 0.0–1.2)
CO2: 25 mmol/L (ref 20–29)
Calcium: 9.2 mg/dL (ref 8.7–10.3)
Chloride: 103 mmol/L (ref 96–106)
Creatinine, Ser: 0.89 mg/dL (ref 0.57–1.00)
GFR calc Af Amer: 81 mL/min/{1.73_m2} (ref >59)
GFR calc non Af Amer: 70 mL/min/{1.73_m2} (ref >59)
Globulin, Total: 3 g/dL (ref 1.5–4.5)
Glucose: 121 mg/dL — ABNORMAL HIGH (ref 65–99)
Potassium: 4.2 mmol/L (ref 3.5–5.2)
Sodium: 141 mmol/L (ref 134–144)
Total Protein: 7.4 g/dL (ref 6.0–8.5)

## 2019-08-18 LAB — HEMOGLOBIN A1C
Est. average glucose Bld gHb Est-mCnc: 126 mg/dL
Hgb A1c MFr Bld: 6 % — ABNORMAL HIGH (ref 4.8–5.6)

## 2019-08-18 LAB — LIPID PANEL
Chol/HDL Ratio: 2.9 ratio (ref 0.0–4.4)
Cholesterol, Total: 144 mg/dL (ref 100–199)
HDL: 50 mg/dL (ref >39)
LDL Chol Calc (NIH): 80 mg/dL (ref 0–99)
Triglycerides: 67 mg/dL (ref 0–149)
VLDL Cholesterol Cal: 14 mg/dL (ref 5–40)

## 2019-08-18 LAB — CBC
Hematocrit: 38.2 % (ref 34.0–46.6)
Hemoglobin: 12.6 g/dL (ref 11.1–15.9)
MCH: 26.6 pg (ref 26.6–33.0)
MCHC: 33 g/dL (ref 31.5–35.7)
MCV: 81 fL (ref 79–97)
Platelets: 283 10*3/uL (ref 150–450)
RBC: 4.74 x10E6/uL (ref 3.77–5.28)
RDW: 14.7 % (ref 11.7–15.4)
WBC: 7.2 10*3/uL (ref 3.4–10.8)

## 2019-08-18 LAB — HEPATITIS C ANTIBODY: Hep C Virus Ab: <0.1 s/co ratio (ref 0.0–0.9)

## 2019-08-18 LAB — HIV ANTIBODY (ROUTINE TESTING W REFLEX): HIV Screen 4th Generation wRfx: NONREACTIVE

## 2019-08-18 LAB — TSH: TSH: 5.06 u[IU]/mL — ABNORMAL HIGH (ref 0.450–4.500)

## 2019-08-31 ENCOUNTER — Telehealth: Payer: Self-pay | Admitting: Nurse Practitioner

## 2019-08-31 NOTE — Telephone Encounter (Signed)
Patient came in and said that she is having a reaction to her blood pressure medication. She said that she had stopped taking it after reading the side effects (online). Her ankles have been severely swollen along with some of her legs.  Please contact patient to either discontinue or change medication.

## 2019-08-31 NOTE — Telephone Encounter (Signed)
Please advise 

## 2019-09-02 ENCOUNTER — Other Ambulatory Visit: Payer: Self-pay | Admitting: Nurse Practitioner

## 2019-09-02 DIAGNOSIS — I10 Essential (primary) hypertension: Secondary | ICD-10-CM

## 2019-09-02 MED ORDER — LOSARTAN POTASSIUM 50 MG PO TABS: 50.0000 mg | ORAL_TABLET | Freq: Every day | ORAL | 0 refills | Status: DC

## 2019-09-02 NOTE — Telephone Encounter (Signed)
Stop amlodipine. New BP med sent to pharmacy. She will need f/u BP check in 2-3 weeks

## 2019-09-03 NOTE — Telephone Encounter (Signed)
Patient notified of the following information. BP f/up made for 09/30/2019.

## 2019-09-29 ENCOUNTER — Telehealth: Payer: Self-pay

## 2019-09-29 NOTE — Telephone Encounter (Signed)

## 2019-09-30 ENCOUNTER — Encounter: Payer: Self-pay | Admitting: Internal Medicine

## 2019-09-30 ENCOUNTER — Other Ambulatory Visit: Payer: Self-pay

## 2019-09-30 ENCOUNTER — Ambulatory Visit (INDEPENDENT_AMBULATORY_CARE_PROVIDER_SITE_OTHER): Payer: No Typology Code available for payment source | Admitting: Internal Medicine

## 2019-09-30 VITALS — BP 199/94 | HR 57 | Temp 97.5°F | Resp 17 | Ht 70.0 in | Wt 259.0 lb

## 2019-09-30 DIAGNOSIS — R7989 Other specified abnormal findings of blood chemistry: Secondary | ICD-10-CM

## 2019-09-30 DIAGNOSIS — Z9189 Other specified personal risk factors, not elsewhere classified: Secondary | ICD-10-CM

## 2019-09-30 DIAGNOSIS — R7303 Prediabetes: Secondary | ICD-10-CM | POA: Diagnosis not present

## 2019-09-30 DIAGNOSIS — I10 Essential (primary) hypertension: Secondary | ICD-10-CM

## 2019-09-30 DIAGNOSIS — Z1331 Encounter for screening for depression: Secondary | ICD-10-CM | POA: Diagnosis not present

## 2019-09-30 MED ORDER — HYDROCHLOROTHIAZIDE 25 MG PO TABS: 25.0000 mg | ORAL_TABLET | Freq: Every day | ORAL | 3 refills | Status: DC

## 2019-09-30 MED FILL — HYDROCHLOROTHIAZIDE 25 MG T: 25 | 90 days supply | Qty: 90 | Fill #0

## 2019-09-30 NOTE — Patient Instructions (Addendum)
Please take your blood pressure at home in the morning prior to drinking any caffeine. Sit with both feet flat on the floor. Rest for at least 2 minutes prior to checking your blood pressure to allow it to normalize after any movement. Write this number down on your log and please bring your log to every office visit. Call the clinic if you have numbers consistently greater than 150 on the top or 100 on the bottom.    DASH Eating Plan DASH stands for "Dietary Approaches to Stop Hypertension." The DASH eating plan is a healthy eating plan that has been shown to reduce high blood pressure (hypertension). It may also reduce your risk for type 2 diabetes, heart disease, and stroke. The DASH eating plan may also help with weight loss. What are tips for following this plan?  General guidelines  Avoid eating more than 2,300 mg (milligrams) of salt (sodium) a day. If you have hypertension, you may need to reduce your sodium intake to 1,500 mg a day.  Limit alcohol intake to no more than 1 drink a day for nonpregnant women and 2 drinks a day for men. One drink equals 12 oz of beer, 5 oz of wine, or 1 oz of hard liquor.  Work with your health care provider to maintain a healthy body weight or to lose weight. Ask what an ideal weight is for you.  Get at least 30 minutes of exercise that causes your heart to beat faster (aerobic exercise) most days of the week. Activities may include walking, swimming, or biking.  Work with your health care provider or diet and nutrition specialist (dietitian) to adjust your eating plan to your individual calorie needs. Reading food labels   Check food labels for the amount of sodium per serving. Choose foods with less than 5 percent of the Daily Value of sodium. Generally, foods with less than 300 mg of sodium per serving fit into this eating plan.  To find whole grains, look for the word "whole" as the first word in the ingredient list. Shopping  Buy products  labeled as "low-sodium" or "no salt added."  Buy fresh foods. Avoid canned foods and premade or frozen meals. Cooking  Avoid adding salt when cooking. Use salt-free seasonings or herbs instead of table salt or sea salt. Check with your health care provider or pharmacist before using salt substitutes.  Do not fry foods. Cook foods using healthy methods such as baking, boiling, grilling, and broiling instead.  Cook with heart-healthy oils, such as olive, canola, soybean, or sunflower oil. Meal planning  Eat a balanced diet that includes: ? 5 or more servings of fruits and vegetables each day. At each meal, try to fill half of your plate with fruits and vegetables. ? Up to 6-8 servings of whole grains each day. ? Less than 6 oz of lean meat, poultry, or fish each day. A 3-oz serving of meat is about the same size as a deck of cards. One egg equals 1 oz. ? 2 servings of low-fat dairy each day. ? A serving of nuts, seeds, or beans 5 times each week. ? Heart-healthy fats. Healthy fats called Omega-3 fatty acids are found in foods such as flaxseeds and coldwater fish, like sardines, salmon, and mackerel.  Limit how much you eat of the following: ? Canned or prepackaged foods. ? Food that is high in trans fat, such as fried foods. ? Food that is high in saturated fat, such as fatty meat. ? Sweets,  desserts, sugary drinks, and other foods with added sugar. ? Full-fat dairy products.  Do not salt foods before eating.  Try to eat at least 2 vegetarian meals each week.  Eat more home-cooked food and less restaurant, buffet, and fast food.  When eating at a restaurant, ask that your food be prepared with less salt or no salt, if possible. What foods are recommended? The items listed may not be a complete list. Talk with your dietitian about what dietary choices are best for you. Grains Whole-grain or whole-wheat bread. Whole-grain or whole-wheat pasta. Brown rice. [place]. Bulgur.  Whole-grain and low-sodium cereals. Pita bread. Low-fat, low-sodium crackers. Whole-wheat flour tortillas. Vegetables Fresh or frozen vegetables (raw, steamed, roasted, or grilled). Low-sodium or reduced-sodium tomato and vegetable juice. Low-sodium or reduced-sodium tomato sauce and tomato paste. Low-sodium or reduced-sodium canned vegetables. Fruits All fresh, dried, or frozen fruit. Canned fruit in natural juice (without added sugar). Meat and other protein foods Skinless chicken or [place]. Ground chicken or [place]. Pork with fat trimmed off. Fish and seafood. Egg whites. Dried beans, peas, or lentils. Unsalted nuts, nut butters, and seeds. Unsalted canned beans. Lean cuts of beef with fat trimmed off. Low-sodium, lean deli meat. Dairy Low-fat (1%) or fat-free (skim) milk. Fat-free, low-fat, or reduced-fat cheeses. Nonfat, low-sodium ricotta or cottage cheese. Low-fat or nonfat yogurt. Low-fat, low-sodium cheese. Fats and oils Soft margarine without trans fats. Vegetable oil. Low-fat, reduced-fat, or light mayonnaise and salad dressings (reduced-sodium). Canola, safflower, olive, soybean, and sunflower oils. Avocado. Seasoning and other foods Herbs. Spices. Seasoning mixes without salt. Unsalted popcorn and pretzels. Fat-free sweets. What foods are not recommended? The items listed may not be a complete list. Talk with your dietitian about what dietary choices are best for you. Grains Baked goods made with fat, such as croissants, muffins, or some breads. Dry pasta or rice meal packs. Vegetables Creamed or fried vegetables. Vegetables in a cheese sauce. Regular canned vegetables (not low-sodium or reduced-sodium). Regular canned tomato sauce and paste (not low-sodium or reduced-sodium). Regular tomato and vegetable juice (not low-sodium or reduced-sodium). Angie Fava. Olives. Fruits Canned fruit in a light or heavy syrup. Fried fruit. Fruit in cream or butter sauce. Meat and other protein  foods Fatty cuts of meat. Ribs. Fried meat. Berniece Salines. Sausage. Bologna and other processed lunch meats. Salami. Fatback. Hotdogs. Bratwurst. Salted nuts and seeds. Canned beans with added salt. Canned or smoked fish. Whole eggs or egg yolks. Chicken or [place] with skin. Dairy Whole or 2% milk, cream, and half-and-half. Whole or full-fat cream cheese. Whole-fat or sweetened yogurt. Full-fat cheese. Nondairy creamers. Whipped toppings. Processed cheese and cheese spreads. Fats and oils Butter. Stick margarine. Lard. Shortening. Ghee. Bacon fat. Tropical oils, such as coconut, palm kernel, or palm oil. Seasoning and other foods Salted popcorn and pretzels. Onion salt, garlic salt, seasoned salt, table salt, and sea salt. Worcestershire sauce. Tartar sauce. Barbecue sauce. Teriyaki sauce. Soy sauce, including reduced-sodium. Steak sauce. Canned and packaged gravies. Fish sauce. Oyster sauce. Cocktail sauce. Horseradish that you find on the shelf. Ketchup. Mustard. Meat flavorings and tenderizers. Bouillon cubes. Hot sauce and Tabasco sauce. Premade or packaged marinades. Premade or packaged taco seasonings. Relishes. Regular salad dressings. Where to find more information:  National Heart, Lung, and [place]: https://wilson-eaton.com/  American Heart Association: www.heart.org Summary  The DASH eating plan is a healthy eating plan that has been shown to reduce high blood pressure (hypertension). It may also reduce your risk for type 2 diabetes, heart  disease, and stroke.  With the DASH eating plan, you should limit salt (sodium) intake to 2,300 mg a day. If you have hypertension, you may need to reduce your sodium intake to 1,500 mg a day.  When on the DASH eating plan, aim to eat more fresh fruits and vegetables, whole grains, lean proteins, low-fat dairy, and heart-healthy fats.  Work with your health care provider or diet and nutrition specialist (dietitian) to adjust your eating plan to your  individual calorie needs. This information is not intended to replace advice given to you by your health care provider. Make sure you discuss any questions you have with your health care provider. Document Revised: 07/25/2017 Document Reviewed: 08/05/2016 Elsevier Patient Education  2020 [employer].

## 2019-09-30 NOTE — Progress Notes (Addendum)
Subjective:    Amy Schultz - 64 y.o. female MRN [id number]  Date of birth: [birth year]  HPI  Amy Schultz is here for HTN follow up.  Chronic HTN Disease Monitoring:  Home BP Monitoring - Monitors at work. Numbers have been 170-190/90-100 recently.  Chest pain- no  Dyspnea- no Headache - no  Medications: Losartan 50 mg  Compliance- no Lightheadedness- no  Edema- no   Reports that she was previously on HCTZ. Tolerated well. Has also been on Amlodipine but had edema.    The 10-year ASCVD risk score Amy Schultz DC Amy Schultz., et al., 2013) is: 18.5%   Values used to calculate the score:     Age: 78 years     Sex: Female     Is Non-Hispanic African American: Yes     Diabetic: No     Tobacco smoker: No     Systolic Blood Pressure: 123XX123 mmHg     Is BP treated: Yes     HDL Cholesterol: 50 mg/dL     Total Cholesterol: 144 mg/dL    Health Maintenance Due  Topic Date Due  . TETANUS/TDAP  01/27/1977    -  reports that she has never smoked. She has never used smokeless tobacco. - Review of Systems: Per HPI. - Past Medical History: Patient Active Problem List   Diagnosis Date Noted  . Prediabetes 09/30/2019  . Candidate for statin therapy due to risk of future cardiovascular event 09/30/2019  . Hypertension 11/07/2011   - Medications: reviewed and updated   Objective:   Physical Exam BP (!) 199/94 (BP Location: Right Arm)   Pulse (!) 57   Temp (!) 97.5 F (36.4 C) (Temporal)   Resp 17   Ht 5\' 10"  (1.778 m)   Wt 259 lb (117.5 kg)   SpO2 98%   BMI 37.16 kg/m BP 178/96 (Left Arm) Physical Exam  Constitutional: She is oriented to person, place, and time and well-developed, well-nourished, and in no distress. No distress.  HENT:  Head: Normocephalic and atraumatic.  Eyes: Conjunctivae and EOM are normal.  Cardiovascular: Normal rate, regular rhythm and normal heart sounds.  No murmur heard. Pulmonary/Chest: Effort normal and breath sounds normal. No  respiratory distress.  Musculoskeletal:        General: Normal range of motion.  Neurological: She is alert and oriented to person, place, and time.  Skin: Skin is warm and dry. She is not diaphoretic.  Psychiatric: Affect and judgment normal.           Assessment & Plan:   1. Essential hypertension BP very elevated today in both arms. Will add HCTZ to Losartan. Return in one week for BMET and BP check.  Counseled on blood pressure goal of less than 130/80, low-sodium, DASH diet, medication compliance, 150 minutes of moderate intensity exercise per week. Discussed medication compliance, adverse effects. - hydrochlorothiazide (HYDRODIURIL) 25 MG tablet; Take 1 tablet (25 mg total) by mouth daily.  Dispense: 90 tablet; Refill: 3 - Basic Metabolic Panel; Future  2. Prediabetes Discussed with patient that her A1c was 6.0 in Dec 2020 putting her in prediabetic range. Counseled on carb modified diet and exercise regimen. Discussed how progressing to DM can potentially impact multiple organ systems.   3. Elevated TSH TSH 5 in Dec 2020. Obtain thyroid panel.  - Thyroid Profile  4. Candidate for statin therapy due to risk of future cardiovascular event ASCVD risk 18.5%. Counseled patient on ways to lower this risk with diet, exercise, BP  control, glucose control. Advised to start baby ASA and statin therapy. Patient declined.   5. Screening for depression PHQ-9 score of 0. GAD-7 score of 0. Reviewed.    Amy Schultz, D.O. 09/30/2019, 9:20 AM Primary Care at [facility]

## 2019-10-01 ENCOUNTER — Encounter: Payer: Self-pay | Admitting: Internal Medicine

## 2019-10-01 DIAGNOSIS — R7989 Other specified abnormal findings of blood chemistry: Secondary | ICD-10-CM | POA: Insufficient documentation

## 2019-10-01 LAB — THYROID PANEL
Free Thyroxine Index: 1.8 (ref 1.2–4.9)
T3 Uptake Ratio: 28 % (ref 24–39)
T4, Total: 6.6 ug/dL (ref 4.5–12.0)

## 2019-10-05 NOTE — Progress Notes (Signed)
Patient notified of results & recommendations. Expressed understanding.

## 2019-10-07 ENCOUNTER — Other Ambulatory Visit (INDEPENDENT_AMBULATORY_CARE_PROVIDER_SITE_OTHER): Payer: No Typology Code available for payment source

## 2019-10-07 DIAGNOSIS — I10 Essential (primary) hypertension: Secondary | ICD-10-CM

## 2019-10-07 NOTE — Progress Notes (Signed)
Patient here for repeat BMP.labs

## 2019-10-08 LAB — BASIC METABOLIC PANEL
BUN/Creatinine Ratio: 15 (ref 12–28)
BUN: 13 mg/dL (ref 8–27)
CO2: 27 mmol/L (ref 20–29)
Calcium: 9.3 mg/dL (ref 8.7–10.3)
Chloride: 97 mmol/L (ref 96–106)
Creatinine, Ser: 0.87 mg/dL (ref 0.57–1.00)
GFR calc Af Amer: 83 mL/min/{1.73_m2} (ref >59)
GFR calc non Af Amer: 72 mL/min/{1.73_m2} (ref >59)
Glucose: 87 mg/dL (ref 65–99)
Potassium: 3.9 mmol/L (ref 3.5–5.2)
Sodium: 138 mmol/L (ref 134–144)

## 2019-10-08 NOTE — Progress Notes (Signed)
Patient notified of results & recommendations. Expressed understanding.

## 2019-10-27 ENCOUNTER — Other Ambulatory Visit: Payer: Self-pay | Admitting: Obstetrics and Gynecology

## 2019-11-03 ENCOUNTER — Other Ambulatory Visit: Payer: Self-pay

## 2019-11-03 ENCOUNTER — Ambulatory Visit
Admission: EM | Admit: 2019-11-03 | Discharge: 2019-11-03 | Disposition: A | Discharge status: Home / Self Care | Payer: No Typology Code available for payment source | Attending: Family Medicine | Admitting: Family Medicine

## 2019-11-03 ENCOUNTER — Encounter: Payer: Self-pay | Admitting: Emergency Medicine

## 2019-11-03 DIAGNOSIS — R21 Rash and other nonspecific skin eruption: Secondary | ICD-10-CM

## 2019-11-03 MED ORDER — TRIAMCINOLONE ACETONIDE 0.1 % EX CREA: 1.0000 "application " | TOPICAL_CREAM | Freq: Two times a day (BID) | CUTANEOUS | 0 refills | Status: DC

## 2019-11-03 MED ORDER — HYDROXYZINE HCL 25 MG PO TABS: 25.0000 mg | ORAL_TABLET | Freq: Four times a day (QID) | ORAL | 0 refills | Status: DC | PRN

## 2019-11-03 MED FILL — hydrOXYzine HCL 25 MG TABS: 25 | 5 days supply | Qty: 20 | Fill #0

## 2019-11-03 MED FILL — TRIAMCINOLONE 0.1% CREAM: 0.1 | 15 days supply | Qty: 45 | Fill #0

## 2019-11-03 NOTE — Discharge Instructions (Signed)
Please apply triamcinolone cream/Kenalog twice daily to areas of rash on ankles May take daily antihistamine like Zyrtec/Claritin to help with Please use hydroxyzine as needed for itching throughout the day, may cause drowsiness, do not drive or work after taking Please monitor rash and follow-up if not improving or worsening, spreading, developing other symptoms with rash

## 2019-11-03 NOTE — ED Triage Notes (Signed)
Pt presents to [facility] for assessment of rash to bilateral lower legs/ankles x 3 weeks.  C/o itching and swelling.

## 2019-11-03 NOTE — ED Notes (Signed)
Patient able to ambulate independently  

## 2019-11-03 NOTE — ED Provider Notes (Signed)
[name]    CSN: [id number] Arrival date & time: 11/03/19  [id number]      History   Chief Complaint Chief Complaint  Patient presents with  . Rash    HPI Amy Schultz is a 64 y.o. female history of hypertension, presenting today for evaluation of a rash.  Patient notes that for the past 3 weeks she has had a rash to her ankles.  More prominent on the right.  Has associated itching, burning and pain.  She denies rash on upper extremities, but has felt generalized itching to arms and back.  No rash in these areas.  Denies any other symptoms associated with this.  States that initially she was having some swelling that started after starting a new blood pressure pill a couple months ago, but she stopped the new medicine.  She cannot recall what the medicine was called.  Rashes started since.  Has been using Benadryl cream which has helped some.  Denies significant changing or spreading since onset.  Denies any new hygiene products, lotions, detergents, soaps.Marland Kitchen  HPI  Past Medical History:  Diagnosis Date  . Hypertension   . Thyroid disease     Patient Active Problem List   Diagnosis Date Noted  . Elevated TSH 10/01/2019  . Prediabetes 09/30/2019  . Candidate for statin therapy due to risk of future cardiovascular event 09/30/2019  . Hypertension 11/07/2011    Past Surgical History:  Procedure Laterality Date  . COLONOSCOPY    . THYROID SURGERY     didn't remove thyroid  . TUBAL LIGATION      OB History    Gravida  4   Para  3   Term      Preterm      AB  1   Living        SAB  1   TAB      Ectopic      Multiple      Live Births               Home Medications    Prior to Admission medications   Medication Sig Start Date End Date Taking? Authorizing Provider  aspirin 81 MG chewable tablet Chew by mouth daily.   Yes [provider]  hydrochlorothiazide (HYDRODIURIL) 25 MG tablet Take 1 tablet (25 mg total) by mouth daily.  09/30/19   Nicolette Bang, DO  hydrOXYzine (ATARAX/VISTARIL) 25 MG tablet Take 1 tablet (25 mg total) by mouth every 6 (six) hours as needed for itching. 11/03/19   Matther Labell C, PA-C  triamcinolone cream (KENALOG) 0.1 % Apply 1 application topically 2 (two) times daily. 11/03/19   Ramsey Midgett C, PA-C  losartan (COZAAR) 50 MG tablet Take 1 tablet (50 mg total) by mouth daily. 09/02/19 11/03/19  Gildardo Pounds, NP    Family History Family History  Problem Relation Age of Onset  . Diabetes Mother   . Hypertension Mother   . Hypertension Sister   . Breast cancer Sister        unsure age of onset  . Breast cancer Maternal Aunt        unsure age of onset  . Diabetes Maternal Grandmother   . Colon cancer Neg Hx   . Esophageal cancer Neg Hx   . Rectal cancer Neg Hx   . Stomach cancer Neg Hx   . Colon polyps Neg Hx     Social History Social History   Tobacco Use  .  Smoking status: Never Smoker  . Smokeless tobacco: Never Used  Substance Use Topics  . Alcohol use: No  . Drug use: No     Allergies   Codeine, Darvocet [propoxyphene n-acetaminophen], Demerol, Hydrocodone, and Penicillins   Review of Systems Review of Systems  Constitutional: Negative for fatigue and fever.  Eyes: Negative for visual disturbance.  Respiratory: Negative for shortness of breath.   Cardiovascular: Negative for chest pain.  Gastrointestinal: Negative for abdominal pain, nausea and vomiting.  Musculoskeletal: Negative for arthralgias and joint swelling.  Skin: Positive for color change and rash. Negative for wound.  Neurological: Negative for dizziness, weakness, light-headedness and headaches.     Physical Exam Triage Vital Signs ED Triage Vitals  Enc Vitals Group     BP      Pulse      Resp      Temp      Temp src      SpO2      Weight      Height      Head Circumference      Peak Flow      Pain Score      Pain Loc      Pain Edu?      Excl. in [place]?    No data  found.  Updated Vital Signs BP (!) 163/91 (BP Location: Left Arm)   Pulse 60   Temp 97.9 F (36.6 C) (Temporal)   Resp 18   SpO2 98%   Visual Acuity Right Eye Distance:   Left Eye Distance:   Bilateral Distance:    Right Eye Near:   Left Eye Near:    Bilateral Near:     Physical Exam Vitals and nursing note reviewed.  Constitutional:      Appearance: She is well-developed.     Comments: No acute distress  HENT:     Head: Normocephalic and atraumatic.     Nose: Nose normal.  Eyes:     Conjunctiva/sclera: Conjunctivae normal.  Cardiovascular:     Rate and Rhythm: Normal rate.  Pulmonary:     Effort: Pulmonary effort is normal. No respiratory distress.  Abdominal:     General: There is no distension.  Musculoskeletal:        General: Normal range of motion.     Cervical back: Neck supple.     Comments: Dorsalis pedis 2+ on right  Skin:    General: Skin is warm and dry.     Comments: Right ankle with areas of hyperpigmentation with very faint papular lesions, appears slightly dry, faintly erythematous, no significant warmth or induration, lesions noted on medial and lateral aspects of ankle; no significant lesions noted on left ankle, no rash noted elsewhere on extremities  Neurological:     Mental Status: She is alert and oriented to person, place, and time.      UC Treatments / Results  Labs (all labs ordered are listed, but only abnormal results are displayed) Labs Reviewed - No data to display  EKG   Radiology No results found.  Procedures Procedures (including critical care time)  Medications Ordered in UC Medications - No data to display  Initial Impression / Assessment and Plan / UC Course  I have reviewed the triage vital signs and the nursing notes.  Pertinent labs & imaging results that were available during my care of the patient were reviewed by me and considered in my medical decision making (see chart for details).  Rash appears  slightly eczematous, although no history of this.  Will do trial of Kenalog/triamcinolone cream over the next week, may supplement with antihistamines for itching.  Hydroxyzine as needed, discussed possible drowsiness associated with this medicine.  No systemic symptoms.  Discussed strict return precautions. Patient verbalized understanding and is agreeable with plan.  Final Clinical Impressions(s) / UC Diagnoses   Final diagnoses:  Rash and nonspecific skin eruption     Discharge Instructions     Please apply triamcinolone cream/Kenalog twice daily to areas of rash on ankles May take daily antihistamine like Zyrtec/Claritin to help with Please use hydroxyzine as needed for itching throughout the day, may cause drowsiness, do not drive or work after taking Please monitor rash and follow-up if not improving or worsening, spreading, developing other symptoms with rash    ED Prescriptions    Medication Sig Dispense Auth. Provider   triamcinolone cream (KENALOG) 0.1 % Apply 1 application topically 2 (two) times daily. 45 g Hillary Schwegler C, PA-C   hydrOXYzine (ATARAX/VISTARIL) 25 MG tablet Take 1 tablet (25 mg total) by mouth every 6 (six) hours as needed for itching. 20 tablet Jamiere Gulas, [place] C, PA-C     PDMP not reviewed this encounter.   Janith Lima, [place] 11/03/19 [phone]

## 2019-11-04 ENCOUNTER — Other Ambulatory Visit: Payer: Self-pay

## 2019-11-04 ENCOUNTER — Encounter (HOSPITAL_BASED_OUTPATIENT_CLINIC_OR_DEPARTMENT_OTHER): Payer: Self-pay | Admitting: Obstetrics and Gynecology

## 2019-11-04 ENCOUNTER — Telehealth: Payer: Self-pay | Admitting: Emergency Medicine

## 2019-11-04 NOTE — Telephone Encounter (Signed)
Left voicemail checking in on patient, and encouraged return call with any continuing questions or concerns.

## 2019-11-04 NOTE — Progress Notes (Signed)
Spoke w/ via phone for pre-op interview---[facility] needs dos----   cbc, bmet, ekg           COVID test ------11-06-2019 at 900 am Arrive at -------1215 pm 11-10-2019 No food after midnight, clear liquids from midnight until 815 am then npo Medications to take morning of surgery -----hydroxyazine prn Diabetic medication -----n/a Patient Special Instructions ----- Pre-Op special Istructions ----- Patient verbalized understanding of instructions that were given at this phone interview. Patient denies shortness of breath, chest pain, fever, cough a this phone interview.

## 2019-11-06 ENCOUNTER — Other Ambulatory Visit (HOSPITAL_COMMUNITY)
Admission: RE | Admit: 2019-11-06 | Discharge: 2019-11-06 | Disposition: A | Discharge status: Home / Self Care | Payer: No Typology Code available for payment source | Source: Ambulatory Visit | Attending: Obstetrics and Gynecology | Admitting: Obstetrics and Gynecology

## 2019-11-06 DIAGNOSIS — Z20822 Contact with and (suspected) exposure to covid-19: Secondary | ICD-10-CM | POA: Diagnosis not present

## 2019-11-06 DIAGNOSIS — Z01812 Encounter for preprocedural laboratory examination: Secondary | ICD-10-CM | POA: Diagnosis present

## 2019-11-06 LAB — SARS CORONAVIRUS 2 (TAT 6-24 HRS): SARS Coronavirus 2: NEGATIVE

## 2019-11-09 NOTE — Anesthesia Preprocedure Evaluation (Addendum)
Anesthesia Evaluation  Patient identified by MRN, date of birth, ID band  Reviewed: Allergy & Precautions, NPO status , Patient's Chart, lab work & pertinent test results  Airway Mallampati: II  TM Distance: >3 FB Neck ROM: Full    Dental no notable dental hx. (+) Teeth Intact, Dental Advisory Given   Pulmonary neg pulmonary ROS,    Pulmonary exam normal breath sounds clear to auscultation       Cardiovascular hypertension, Normal cardiovascular exam Rhythm:Regular Rate:Normal     Neuro/Psych negative neurological ROS  negative psych ROS   GI/Hepatic negative GI ROS, Neg liver ROS,   Endo/Other  negative endocrine ROS  Renal/GU      Musculoskeletal negative musculoskeletal ROS (+)   Abdominal (+) + obese,   Peds  Hematology   Anesthesia Other Findings   Reproductive/Obstetrics                           Anesthesia Physical Anesthesia Plan  ASA: III  Anesthesia Plan: General   Post-op Pain Management:    Induction: Intravenous  PONV Risk Score and Plan: 3 and Treatment may vary due to age or medical condition, Ondansetron and Dexamethasone  Airway Management Planned: LMA  Additional Equipment: None  Intra-op Plan:   Post-operative Plan:   Informed Consent: I have reviewed the patients History and Physical, chart, labs and discussed the procedure including the risks, benefits and alternatives for the proposed anesthesia with the patient or authorized representative who has indicated his/her understanding and acceptance.     Dental advisory given  Plan Discussed with:   Anesthesia Plan Comments:        Anesthesia Quick Evaluation

## 2019-11-10 ENCOUNTER — Ambulatory Visit (HOSPITAL_BASED_OUTPATIENT_CLINIC_OR_DEPARTMENT_OTHER): Payer: No Typology Code available for payment source | Admitting: Anesthesiology

## 2019-11-10 ENCOUNTER — Encounter (HOSPITAL_BASED_OUTPATIENT_CLINIC_OR_DEPARTMENT_OTHER): Payer: Self-pay | Admitting: Obstetrics and Gynecology

## 2019-11-10 ENCOUNTER — Encounter (HOSPITAL_BASED_OUTPATIENT_CLINIC_OR_DEPARTMENT_OTHER)
Admission: RE | Disposition: A | Discharge status: Home / Self Care | Payer: Self-pay | Source: Home / Self Care | Attending: Obstetrics and Gynecology

## 2019-11-10 ENCOUNTER — Ambulatory Visit (HOSPITAL_BASED_OUTPATIENT_CLINIC_OR_DEPARTMENT_OTHER)
Admission: RE | Admit: 2019-11-10 | Discharge: 2019-11-10 | Disposition: A | Discharge status: Home / Self Care | Payer: No Typology Code available for payment source | Attending: Obstetrics and Gynecology | Admitting: Obstetrics and Gynecology

## 2019-11-10 ENCOUNTER — Other Ambulatory Visit: Payer: Self-pay

## 2019-11-10 DIAGNOSIS — Z6836 Body mass index (BMI) 36.0-36.9, adult: Secondary | ICD-10-CM | POA: Insufficient documentation

## 2019-11-10 DIAGNOSIS — Z803 Family history of malignant neoplasm of breast: Secondary | ICD-10-CM | POA: Diagnosis not present

## 2019-11-10 DIAGNOSIS — N950 Postmenopausal bleeding: Secondary | ICD-10-CM | POA: Diagnosis present

## 2019-11-10 DIAGNOSIS — I10 Essential (primary) hypertension: Secondary | ICD-10-CM | POA: Insufficient documentation

## 2019-11-10 DIAGNOSIS — Z885 Allergy status to narcotic agent status: Secondary | ICD-10-CM | POA: Insufficient documentation

## 2019-11-10 DIAGNOSIS — E079 Disorder of thyroid, unspecified: Secondary | ICD-10-CM | POA: Insufficient documentation

## 2019-11-10 DIAGNOSIS — Z79899 Other long term (current) drug therapy: Secondary | ICD-10-CM | POA: Diagnosis not present

## 2019-11-10 DIAGNOSIS — Z833 Family history of diabetes mellitus: Secondary | ICD-10-CM | POA: Insufficient documentation

## 2019-11-10 DIAGNOSIS — Z8249 Family history of ischemic heart disease and other diseases of the circulatory system: Secondary | ICD-10-CM | POA: Diagnosis not present

## 2019-11-10 DIAGNOSIS — Z7982 Long term (current) use of aspirin: Secondary | ICD-10-CM | POA: Diagnosis not present

## 2019-11-10 DIAGNOSIS — Z880 Allergy status to penicillin: Secondary | ICD-10-CM | POA: Diagnosis not present

## 2019-11-10 DIAGNOSIS — E669 Obesity, unspecified: Secondary | ICD-10-CM | POA: Insufficient documentation

## 2019-11-10 DIAGNOSIS — Z888 Allergy status to other drugs, medicaments and biological substances status: Secondary | ICD-10-CM | POA: Insufficient documentation

## 2019-11-10 DIAGNOSIS — N840 Polyp of corpus uteri: Secondary | ICD-10-CM | POA: Diagnosis not present

## 2019-11-10 HISTORY — DX: Rash and other nonspecific skin eruption: R21

## 2019-11-10 HISTORY — DX: Postmenopausal bleeding: N95.0

## 2019-11-10 HISTORY — PX: DILATATION & CURETTAGE/HYSTEROSCOPY WITH MYOSURE: SHX6511

## 2019-11-10 LAB — BASIC METABOLIC PANEL
Anion gap: 9 (ref 5–15)
BUN: 14 mg/dL (ref 8–23)
CO2: 26 mmol/L (ref 22–32)
Calcium: 8.5 mg/dL — ABNORMAL LOW (ref 8.9–10.3)
Chloride: 105 mmol/L (ref 98–111)
Creatinine, Ser: 0.8 mg/dL (ref 0.44–1.00)
GFR calc Af Amer: >60 mL/min (ref >60)
GFR calc non Af Amer: >60 mL/min (ref >60)
Glucose, Bld: 96 mg/dL (ref 70–99)
Potassium: 3.6 mmol/L (ref 3.5–5.1)
Sodium: 140 mmol/L (ref 135–145)

## 2019-11-10 LAB — CBC
HCT: 39.3 % (ref 36.0–46.0)
Hemoglobin: 12.4 g/dL (ref 12.0–15.0)
MCH: 25.8 pg — ABNORMAL LOW (ref 26.0–34.0)
MCHC: 31.6 g/dL (ref 30.0–36.0)
MCV: 81.9 fL (ref 80.0–100.0)
Platelets: 253 10*3/uL (ref 150–400)
RBC: 4.8 MIL/uL (ref 3.87–5.11)
RDW: 15.9 % — ABNORMAL HIGH (ref 11.5–15.5)
WBC: 6.9 10*3/uL (ref 4.0–10.5)
nRBC: 0 % (ref 0.0–0.2)

## 2019-11-10 SURGERY — DILATATION & CURETTAGE/HYSTEROSCOPY WITH MYOSURE
Anesthesia: General | Site: Vagina

## 2019-11-10 MED ORDER — ONDANSETRON HCL 4 MG/2ML IJ SOLN
4.0000 mg | Freq: Once | INTRAMUSCULAR | Status: DC | PRN
  Filled 2019-11-10: qty 2

## 2019-11-10 MED ORDER — FENTANYL CITRATE (PF) 100 MCG/2ML IJ SOLN
25.0000 ug | INTRAMUSCULAR | Status: DC | PRN
  Filled 2019-11-10: qty 1

## 2019-11-10 MED ORDER — FENTANYL CITRATE (PF) 100 MCG/2ML IJ SOLN
INTRAMUSCULAR | Status: AC
  Filled 2019-11-10: qty 2

## 2019-11-10 MED ORDER — MIDAZOLAM HCL 2 MG/2ML IJ SOLN
INTRAMUSCULAR | Status: AC
  Filled 2019-11-10: qty 2

## 2019-11-10 MED ORDER — ONDANSETRON HCL 4 MG/2ML IJ SOLN
INTRAMUSCULAR | Status: DC | PRN
  Administered 2019-11-10: 4 mg via INTRAVENOUS

## 2019-11-10 MED ORDER — PROPOFOL 10 MG/ML IV BOLUS
INTRAVENOUS | Status: DC | PRN
  Administered 2019-11-10: 180 mg via INTRAVENOUS

## 2019-11-10 MED ORDER — KETOROLAC TROMETHAMINE 30 MG/ML IJ SOLN
30.0000 mg | Freq: Once | INTRAMUSCULAR | Status: DC | PRN
  Filled 2019-11-10: qty 1

## 2019-11-10 MED ORDER — OXYCODONE HCL 5 MG/5ML PO SOLN
5.0000 mg | Freq: Once | ORAL | Status: DC | PRN
  Filled 2019-11-10: qty 5

## 2019-11-10 MED ORDER — FENTANYL CITRATE (PF) 100 MCG/2ML IJ SOLN
INTRAMUSCULAR | Status: DC | PRN
  Administered 2019-11-10 (×4): 25 ug via INTRAVENOUS

## 2019-11-10 MED ORDER — ONDANSETRON HCL 4 MG/2ML IJ SOLN
INTRAMUSCULAR | Status: AC
  Filled 2019-11-10: qty 2

## 2019-11-10 MED ORDER — LACTATED RINGERS IV SOLN
INTRAVENOUS | Status: DC
  Filled 2019-11-10: qty 1000

## 2019-11-10 MED ORDER — DEXAMETHASONE SODIUM PHOSPHATE 10 MG/ML IJ SOLN
INTRAMUSCULAR | Status: AC
  Filled 2019-11-10: qty 1

## 2019-11-10 MED ORDER — SODIUM CHLORIDE 0.9 % IR SOLN
Status: DC | PRN
  Administered 2019-11-10: 3000 mL

## 2019-11-10 MED ORDER — DEXAMETHASONE SODIUM PHOSPHATE 10 MG/ML IJ SOLN
INTRAMUSCULAR | Status: DC | PRN
  Administered 2019-11-10: 10 mg via INTRAVENOUS

## 2019-11-10 MED ORDER — KETOROLAC TROMETHAMINE 30 MG/ML IJ SOLN
INTRAMUSCULAR | Status: DC | PRN
  Administered 2019-11-10: 30 mg via INTRAVENOUS

## 2019-11-10 MED ORDER — LIDOCAINE 2% (20 MG/ML) 5 ML SYRINGE
INTRAMUSCULAR | Status: DC | PRN
  Administered 2019-11-10: 100 mg via INTRAVENOUS

## 2019-11-10 MED ORDER — IBUPROFEN 800 MG PO TABS: 800.0000 mg | ORAL_TABLET | Freq: Three times a day (TID) | ORAL | 2 refills | Status: DC | PRN

## 2019-11-10 MED ORDER — OXYCODONE HCL 5 MG PO TABS
5.0000 mg | ORAL_TABLET | Freq: Once | ORAL | Status: DC | PRN
  Filled 2019-11-10: qty 1

## 2019-11-10 MED ORDER — KETOROLAC TROMETHAMINE 30 MG/ML IJ SOLN
INTRAMUSCULAR | Status: AC
  Filled 2019-11-10: qty 1

## 2019-11-10 MED ORDER — PROPOFOL 10 MG/ML IV BOLUS
INTRAVENOUS | Status: AC
  Filled 2019-11-10: qty 20

## 2019-11-10 MED ORDER — MIDAZOLAM HCL 5 MG/5ML IJ SOLN
INTRAMUSCULAR | Status: DC | PRN
  Administered 2019-11-10: 2 mg via INTRAVENOUS

## 2019-11-10 MED FILL — IBUPROFEN 800 MG TABS: 800 | 10 days supply | Qty: 30 | Fill #0

## 2019-11-10 SURGICAL SUPPLY — 25 items
BIPOLAR CUTTING LOOP 21FR (ELECTRODE)
CANISTER SUCT 3000ML PPV (MISCELLANEOUS) ×2 IMPLANT
CATH ROBINSON RED A/P 16FR (CATHETERS) IMPLANT
COVER WAND RF STERILE (DRAPES) ×2 IMPLANT
DEVICE MYOSURE LITE (MISCELLANEOUS) IMPLANT
DEVICE MYOSURE REACH (MISCELLANEOUS) ×1 IMPLANT
DILATOR CANAL MILEX (MISCELLANEOUS) IMPLANT
GAUZE 4X4 16PLY RFD (DISPOSABLE) ×2 IMPLANT
GLOVE BIOGEL PI IND STRL 7.0 (GLOVE) ×1 IMPLANT
GLOVE BIOGEL PI INDICATOR 7.0 (GLOVE) ×1
GLOVE ECLIPSE 6.5 STRL STRAW (GLOVE) ×2 IMPLANT
GOWN STRL REUS W/TWL LRG LVL3 (GOWN DISPOSABLE) ×2 IMPLANT
IV NS IRRIG 3000ML ARTHROMATIC (IV SOLUTION) ×2 IMPLANT
KIT PROCEDURE FLUENT (KITS) ×2 IMPLANT
KIT TURNOVER CYSTO (KITS) ×2 IMPLANT
LOOP CUTTING BIPOLAR 21FR (ELECTRODE) IMPLANT
MYOSURE XL FIBROID (MISCELLANEOUS)
PACK VAGINAL MINOR WOMEN LF (CUSTOM PROCEDURE TRAY) ×2 IMPLANT
PAD OB MATERNITY 4.3X12.25 (PERSONAL CARE ITEMS) ×2 IMPLANT
PAD PREP 24X48 CUFFED NSTRL (MISCELLANEOUS) ×2 IMPLANT
SEAL CERVICAL OMNI LOK (ABLATOR) IMPLANT
SEAL ROD LENS SCOPE MYOSURE (ABLATOR) ×2 IMPLANT
SYSTEM TISS REMOVAL MYOSURE XL (MISCELLANEOUS) IMPLANT
TOWEL OR 17X26 10 PK STRL BLUE (TOWEL DISPOSABLE) ×2 IMPLANT
WATER STERILE IRR 500ML POUR (IV SOLUTION) ×2 IMPLANT

## 2019-11-10 NOTE — Transfer of Care (Signed)
Immediate Anesthesia Transfer of Care Note  Patient: Amy Schultz  Procedure(s) Performed: DILATATION & CURETTAGE/HYSTEROSCOPY WITH MYOSURE (N/A Vagina )  Patient Location: PACU  Anesthesia Type:General  Level of Consciousness: awake, drowsy and responds to stimulation  Airway & Oxygen Therapy: Patient Spontanous Breathing and Patient connected to nasal cannula oxygen  Post-op Assessment: Report given to RN and Post -op Vital signs reviewed and stable  Post vital signs: Reviewed and stable  Last Vitals:  Vitals Value Taken Time  BP 135/81 11/10/19 1445  Temp 36.4 C 11/10/19 1445  Pulse 65 11/10/19 1446  Resp 11 11/10/19 1446  SpO2 100 % 11/10/19 1446  Vitals shown include unvalidated device data.  Last Pain:  Vitals:   11/10/19 1303  TempSrc: Oral  PainSc: 5       Patients Stated Pain Goal: 6 ([phone])  Complications: No apparent anesthesia complications

## 2019-11-10 NOTE — Brief Op Note (Signed)
11/10/2019  2:32 PM  PATIENT:  Amy Schultz  64 y.o. female  PRE-OPERATIVE DIAGNOSIS:  Postmenopausal Bleeding, Endometrial Mass  POST-OPERATIVE DIAGNOSIS:  Postmenopausal Bleeding, Endometrial polyps  PROCEDURE:  diagnostic hysteroscopy, D&C, hysteroscopic resection of endometrial polyps  SURGEON:  Surgeon(s) and Role:    * Servando Salina, MD - Primary  PHYSICIAN ASSISTANT:   ASSISTANTS: none   ANESTHESIA:   general Findings: multiple endometrial polyps, irreg blood vessels endometrium EBL:  5 mL   BLOOD ADMINISTERED:none  DRAINS: none   LOCAL MEDICATIONS USED:  NONE  SPECIMEN:  Source of Specimen:  emc with polyps  DISPOSITION OF SPECIMEN:  PATHOLOGY  COUNTS:  YES  TOURNIQUET:  * No tourniquets in log *  DICTATION: .Other Dictation: Dictation Number [id number]  PLAN OF CARE: Discharge to home after PACU  PATIENT DISPOSITION:  PACU - hemodynamically stable.   Delay start of Pharmacological VTE agent (>24hrs) due to surgical blood loss or risk of bleeding: no

## 2019-11-10 NOTE — Discharge Instructions (Signed)
DISCHARGE INSTRUCTIONS: HYSTEROSCOPY  The following instructions have been prepared to help you care for yourself upon your return home.  May take Ibuprofen after 8:30 pm tonight  May take stool softner while taking narcotic pain medication to prevent constipation.  Drink plenty of water.  Personal hygiene: Marland Kitchen Use sanitary pads for vaginal drainage, not tampons. . Shower the day after your procedure. . NO tub baths, pools or Jacuzzis for 2-3 weeks. . Wipe front to back after using the bathroom.  Activity and limitations: . Do NOT drive or operate any equipment for 24 hours. The effects of anesthesia are still present and drowsiness may result. . Do NOT rest in bed all day. . Walking is encouraged. . Walk up and down stairs slowly. . You may resume your normal activity in one to two days or as indicated by your physician. Sexual activity: NO intercourse for at least 2 weeks after the procedure, or as indicated by your Doctor.  Diet: Eat a light meal as desired this evening. You may resume your usual diet tomorrow.  Return to Work: You may resume your work activities in one to two days or as indicated by Marine scientist.  What to expect after your surgery: Expect to have vaginal bleeding/discharge for 2-3 days and spotting for up to 10 days. It is not unusual to have soreness for up to 1-2 weeks. You may have a slight burning sensation when you urinate for the first day. Mild cramps may continue for a couple of days. You may have a regular period in 2-6 weeks.  Call your doctor for any of the following: . Excessive vaginal bleeding or clotting, saturating and changing one pad every hour. . Inability to urinate 6 hours after discharge from hospital. . Pain not relieved by pain medication. . Fever of 100.4 F or greater. . Unusual vaginal discharge or odor.    Post Anesthesia Home Care Instructions  Activity: Get plenty of rest for the remainder of the day. A responsible adult  should stay with you for 24 hours following the procedure.  For the next 24 hours, DO NOT: -Drive a car -Paediatric nurse -Drink alcoholic beverages -Take any medication unless instructed by your physician -Make any legal decisions or sign important papers.  Meals: Start with liquid foods such as gelatin or soup. Progress to regular foods as tolerated. Avoid greasy, spicy, heavy foods. If nausea and/or vomiting occur, drink only clear liquids until the nausea and/or vomiting subsides. Call your physician if vomiting continues.  Special Instructions/Symptoms: Your throat may feel dry or sore from the anesthesia or the breathing tube placed in your throat during surgery. If this causes discomfort, gargle with warm salt water. The discomfort should disappear within 24 hours.  If you had a scopolamine patch placed behind your ear for the management of post- operative nausea and/or vomiting:  1. The medication in the patch is effective for 72 hours, after which it should be removed.  Wrap patch in a tissue and discard in the trash. Wash hands thoroughly with soap and water. 2. You may remove the patch earlier than 72 hours if you experience unpleasant side effects which may include dry mouth, dizziness or visual disturbances. 3. Avoid touching the patch. Wash your hands with soap and water after contact with the patch.

## 2019-11-10 NOTE — Anesthesia Procedure Notes (Signed)
Procedure Name: LMA Insertion Date/Time: 11/10/2019 2:15 PM Performed by: Lollie Sails, CRNA Pre-anesthesia Checklist: Patient identified, Emergency Drugs available, Suction available, Patient being monitored and Timeout performed Patient Re-evaluated:Patient Re-evaluated prior to induction Oxygen Delivery Method: Circle system utilized Preoxygenation: Pre-oxygenation with 100% oxygen Induction Type: IV induction Ventilation: Mask ventilation without difficulty LMA: LMA inserted LMA Size: 4.0 Number of attempts: 1 Placement Confirmation: positive ETCO2 and breath sounds checked- equal and bilateral Tube secured with: Tape Dental Injury: Teeth and Oropharynx as per pre-operative assessment

## 2019-11-10 NOTE — H&P (Signed)
Amy Schultz is an 64 y.o. female. BF with PMB and endometrial thickening and associated endometrial mass on sonohysterogram here for surgical mgmt  Pertinent Gynecological History: Menses: post-menopausal Bleeding: post menopausal bleeding Contraception: none DES exposure: denies Blood transfusions: none Sexually transmitted diseases: no past history Previous GYN Procedures: n/a  Last mammogram: normal Date:2021 Last pap: normal Date: 2021 OB History:    Menstrual History: Menarche age: n/a No LMP recorded. Patient is postmenopausal.    Past Medical History:  Diagnosis Date  . Hypertension   . Postmenopausal bleeding   . Rash    both ankles x 3 weeks prescribed hydroxyazine prn and kenalog cream at urgent care 11-03-2019  . Thyroid disease     Past Surgical History:  Procedure Laterality Date  . COLONOSCOPY    . THYROID SURGERY     didn't remove thyroid  . TUBAL LIGATION      Family History  Problem Relation Age of Onset  . Diabetes Mother   . Hypertension Mother   . Hypertension Sister   . Breast cancer Sister        unsure age of onset  . Breast cancer Maternal Aunt        unsure age of onset  . Diabetes Maternal Grandmother   . Colon cancer Neg Hx   . Esophageal cancer Neg Hx   . Rectal cancer Neg Hx   . Stomach cancer Neg Hx   . Colon polyps Neg Hx     Social History:  reports that she has never smoked. She has never used smokeless tobacco. She reports that she does not drink alcohol or use drugs.  Allergies:  Allergies  Allergen Reactions  . Codeine Nausea Only  . Darvocet [Propoxyphene N-Acetaminophen] Nausea Only  . Demerol Nausea Only  . Hydrocodone Nausea Only  . Penicillins Nausea Only    Medications Prior to Admission  Medication Sig Dispense Refill Last Dose  . aspirin 81 MG chewable tablet Chew by mouth daily.   Past Week at Unknown time  . hydrochlorothiazide (HYDRODIURIL) 25 MG tablet Take 1 tablet (25 mg total) by mouth daily. 90  tablet 3 11/09/2019 at Unknown time  . hydrOXYzine (ATARAX/VISTARIL) 25 MG tablet Take 1 tablet (25 mg total) by mouth every 6 (six) hours as needed for itching. 20 tablet 0 11/09/2019 at Unknown time  . triamcinolone cream (KENALOG) 0.1 % Apply 1 application topically 2 (two) times daily. 45 g 0 11/09/2019 at Unknown time    Review of Systems  All other systems reviewed and are negative.   Blood pressure (!) 183/88, pulse 65, temperature 98.1 F (36.7 C), temperature source Oral, resp. rate 18, height 5\' 10"  (1.778 m), weight 115.6 kg, SpO2 99 %. Physical Exam  Constitutional: She is oriented to person, place, and time. She appears well-developed and well-nourished.  HENT:  Head: Atraumatic.  Eyes: EOM are normal.  GI: Soft.  Genitourinary:    Vagina and uterus normal.     Genitourinary Comments: Cervix parous, vagina (+) blood Adnexa no palp mass   Musculoskeletal:     Cervical back: Neck supple.  Neurological: She is alert and oriented to person, place, and time.  Skin: Skin is warm and dry.  Psychiatric: She has a normal mood and affect.    Results for orders placed or performed during the hospital encounter of 11/10/19 (from the past 24 hour(s))  CBC     Status: Abnormal   Collection Time: 11/10/19 12:47 PM  Result Value  Ref Range   WBC 6.9 4.0 - 10.5 K/uL   RBC 4.80 3.87 - 5.11 MIL/uL   Hemoglobin 12.4 12.0 - 15.0 g/dL   HCT 39.3 36.0 - 46.0 %   MCV 81.9 80.0 - 100.0 fL   MCH 25.8 (L) 26.0 - 34.0 pg   MCHC 31.6 30.0 - 36.0 g/dL   RDW 15.9 (H) 11.5 - 15.5 %   Platelets 253 150 - 400 K/uL   nRBC 0.0 0.0 - 0.2 %  Basic metabolic panel     Status: Abnormal   Collection Time: 11/10/19 12:47 PM  Result Value Ref Range   Sodium 140 135 - 145 mmol/L   Potassium 3.6 3.5 - 5.1 mmol/L   Chloride 105 98 - 111 mmol/L   CO2 26 22 - 32 mmol/L   Glucose, Bld 96 70 - 99 mg/dL   BUN 14 8 - 23 mg/dL   Creatinine, Ser 0.80 0.44 - 1.00 mg/dL   Calcium 8.5 (L) 8.9 - 10.3 mg/dL   GFR  calc non Af Amer >60 >60 mL/min   GFR calc Af Amer >60 >60 mL/min   Anion gap 9 5 - 15    No results found.  Assessment/Plan: PMB Endometrial masses P) dx hysteroscopy, D&C, hysteroscopic resection of endometrial mass Procedure explained. Risk of surgery reviewed including infection, bleeding, injury to surrounding organ, thermal injury, fluid overload and its mgmt, uterine perforation and its risk. All ? answered  Arissa Fagin A Roe Wilner 11/10/2019, 1:59 PM

## 2019-11-10 NOTE — Anesthesia Postprocedure Evaluation (Signed)
Anesthesia Post Note  Patient: Amy Schultz  Procedure(s) Performed: DILATATION & CURETTAGE/HYSTEROSCOPY WITH MYOSURE (N/A Vagina )     Patient location during evaluation: PACU Anesthesia Type: General Level of consciousness: awake and alert Pain management: pain level controlled Vital Signs Assessment: post-procedure vital signs reviewed and stable Respiratory status: spontaneous breathing, nonlabored ventilation, respiratory function stable and patient connected to nasal cannula oxygen Cardiovascular status: blood pressure returned to baseline and stable Postop Assessment: no apparent nausea or vomiting Anesthetic complications: no    Last Vitals:  Vitals:   11/10/19 1445 11/10/19 1446  BP: 135/81   Pulse: 65 68  Resp: 12   Temp: 36.4 C   SpO2: 99% 100%    Last Pain:  Vitals:   11/10/19 1445  TempSrc:   PainSc: 0-No pain                 Barnet Glasgow

## 2019-11-11 LAB — SURGICAL PATHOLOGY

## 2019-11-11 NOTE — Op Note (Signed)
NAME: Amy Schultz, STALOCH MEDICAL RECORD [id number] ACCOUNT [id number] DATE OF BIRTH:[birth year] FACILITY: [facility] LOCATION: [facility] PHYSICIAN:Maizy Davanzo A. Trish Mancinelli, MD  OPERATIVE REPORT  DATE OF PROCEDURE:  11/10/2019  PREOPERATIVE DIAGNOSIS:  Postmenopausal bleeding, endometrial masses.  PROCEDURE PERFORMED:  Diagnostic hysteroscopy, hysteroscopic resection of endometrial polyps, dilation and curettage.  POSTOPERATIVE DIAGNOSES:  Postmenopausal bleeding, endometrial polyps.  ANESTHESIA:  General.  SURGEON:  Servando Salina, MD  ASSISTANT:  None  DESCRIPTION OF PROCEDURE:  Under adequate general anesthesia, the patient was placed in the dorsal lithotomy position.  She was sterilely prepped and draped in usual fashion.  The patient had voided prior to entering the room.  Therefore, she was not  catheterized.  Examination under anesthesia revealed an anteverted uterus.  No adnexal masses could be appreciated.  Bivalve speculum was placed in the vagina.  Small clot was noted in the cervical os.  The anterior lip of the cervix was grasped with a single tooth tenaculum.  The cervix was serially dilated up to #21 [facility] dilator.  A diagnostic hysteroscope was introduced into the uterine cavity.  On entering the endocervical canal.  There was a polypoid lesion right at the lower uterine segment  protruding into the cervical canal.  On further advancing of the hysteroscope multiple large polyps were noted obscuring the view of the left tubal ostia, but the right tubal ostia had 1 adjacent to it.  Using the  Reach resectoscope, the entire cavity and polypoid lesions were resected and both tubal ostia subsequently seen.  The endocervical canal was also inspected.  No lesions noted.  The endometrial wall was also resected.  Irregular endometrial vessels were noted  throughout.  When all tissue was felt to have been removed all instruments were then removed from the vagina.  SPECIMEN:  Labeled  endometrial curettings with polyps were sent to pathology.  ESTIMATED BLOOD LOSS:  5 mL.  FLUID DEFICIT:  300 mL.  COMPLICATIONS:  None.  DISPOSITION:  The patient tolerated the procedure well and was transferred to recovery room in stable condition.  CN/NUANCE  D:11/10/2019 T:11/11/2019 JOB:[id number]

## 2019-11-23 NOTE — Telephone Encounter (Signed)
error 

## 2019-12-02 ENCOUNTER — Telehealth: Payer: Self-pay

## 2019-12-02 NOTE — Telephone Encounter (Signed)
Called patient to do their pre-visit COVID screening.  Call went to voicemail. Unable to do prescreening.  

## 2019-12-03 ENCOUNTER — Ambulatory Visit: Payer: No Typology Code available for payment source | Admitting: Internal Medicine

## 2020-01-03 ENCOUNTER — Telehealth: Payer: No Typology Code available for payment source | Admitting: Internal Medicine

## 2020-01-07 ENCOUNTER — Other Ambulatory Visit: Payer: Self-pay

## 2020-01-07 ENCOUNTER — Ambulatory Visit
Admission: EM | Admit: 2020-01-07 | Discharge: 2020-01-07 | Disposition: A | Discharge status: Home / Self Care | Payer: No Typology Code available for payment source | Attending: Physician Assistant | Admitting: Physician Assistant

## 2020-01-07 ENCOUNTER — Encounter: Payer: Self-pay | Admitting: Emergency Medicine

## 2020-01-07 DIAGNOSIS — R21 Rash and other nonspecific skin eruption: Secondary | ICD-10-CM | POA: Diagnosis not present

## 2020-01-07 MED ORDER — SARNA 0.5-0.5 % EX LOTN: 1.0000 "application " | TOPICAL_LOTION | CUTANEOUS | 0 refills | Status: DC | PRN

## 2020-01-07 MED ORDER — TRIAMCINOLONE ACETONIDE 0.1 % EX CREA: 1.0000 "application " | TOPICAL_CREAM | Freq: Two times a day (BID) | CUTANEOUS | 0 refills | Status: DC

## 2020-01-07 MED ORDER — TRIAMCINOLONE ACETONIDE 0.025 % EX OINT: 1.0000 "application " | TOPICAL_OINTMENT | Freq: Two times a day (BID) | CUTANEOUS | 0 refills | Status: DC

## 2020-01-07 NOTE — ED Provider Notes (Signed)
[name]    CSN: [id number] Arrival date & time: 01/07/20  [id number]      History   Chief Complaint Chief Complaint  Patient presents with  . Rash    HPI Amy Schultz is a 64 y.o. female.   64 year old female comes in for few month history of rash to the medial ankles. She was seen 11/03/2019 for similar symptoms, provided triaminolone with some relief. States it "dried" the rash up, but then it returned. No obvious hygiene product changes. Had thought this to be side effects from BP medication and therefore stopped BP meds for the past 2 weeks, no changes to symptoms. Denies fever, chills. Denies spreading erythema, warmth.      Past Medical History:  Diagnosis Date  . Hypertension   . Postmenopausal bleeding   . Rash    both ankles x 3 weeks prescribed hydroxyazine prn and kenalog cream at urgent care 11-03-2019  . Thyroid disease     Patient Active Problem List   Diagnosis Date Noted  . Elevated TSH 10/01/2019  . Prediabetes 09/30/2019  . Candidate for statin therapy due to risk of future cardiovascular event 09/30/2019  . Hypertension 11/07/2011    Past Surgical History:  Procedure Laterality Date  . COLONOSCOPY    . DILATATION & CURETTAGE/HYSTEROSCOPY WITH MYOSURE N/A 11/10/2019   Procedure: DILATATION & CURETTAGE/HYSTEROSCOPY WITH MYOSURE;  Surgeon: Servando Salina, MD;  Location: [facility];  Service: Gynecology;  Laterality: N/A;  . THYROID SURGERY     didn't remove thyroid  . TUBAL LIGATION      OB History    Gravida  4   Para  3   Term      Preterm      AB  1   Living        SAB  1   TAB      Ectopic      Multiple      Live Births               Home Medications    Prior to Admission medications   Medication Sig Start Date End Date Taking? Authorizing Provider  aspirin 81 MG chewable tablet Chew by mouth daily.    [provider]  camphor-menthol Timoteo Ace) lotion Apply 1 application  topically as needed for itching. 01/07/20   Tasia Catchings, Antonette Hendricks V, PA-C  hydrochlorothiazide (HYDRODIURIL) 25 MG tablet Take 1 tablet (25 mg total) by mouth daily. Patient not taking: Reported on 01/07/2020 09/30/19   Nicolette Bang, DO  ibuprofen (ADVIL) 800 MG tablet Take 1 tablet (800 mg total) by mouth every 8 (eight) hours as needed for moderate pain or cramping. 11/10/19   Servando Salina, MD  triamcinolone (KENALOG) 0.025 % ointment Apply 1 application topically 2 (two) times daily. 01/07/20   Tasia Catchings, Miara Emminger V, PA-C  triamcinolone cream (KENALOG) 0.1 % Apply 1 application topically 2 (two) times daily. 01/07/20   Tasia Catchings, Cynethia Schindler V, PA-C  losartan (COZAAR) 50 MG tablet Take 1 tablet (50 mg total) by mouth daily. 09/02/19 11/03/19  Gildardo Pounds, NP    Family History Family History  Problem Relation Age of Onset  . Diabetes Mother   . Hypertension Mother   . Hypertension Sister   . Breast cancer Sister        unsure age of onset  . Breast cancer Maternal Aunt        unsure age of onset  . Diabetes Maternal Grandmother   .  Colon cancer Neg Hx   . Esophageal cancer Neg Hx   . Rectal cancer Neg Hx   . Stomach cancer Neg Hx   . Colon polyps Neg Hx     Social History Social History   Tobacco Use  . Smoking status: Never Smoker  . Smokeless tobacco: Never Used  Substance Use Topics  . Alcohol use: No  . Drug use: No     Allergies   Codeine, Darvocet [propoxyphene n-acetaminophen], Demerol, Hydrocodone, and Penicillins   Review of Systems Review of Systems  Reason unable to perform ROS: See HPI as above.     Physical Exam Triage Vital Signs ED Triage Vitals  Enc Vitals Group     BP 01/07/20 0843 (!) 192/105     Pulse Rate 01/07/20 0843 (!) 52     Resp 01/07/20 0843 18     Temp 01/07/20 0843 98.3 F (36.8 C)     Temp Source 01/07/20 0843 Oral     SpO2 01/07/20 0843 99 %     Weight --      Height --      Head Circumference --      Peak Flow --      Pain Score 01/07/20 0844 5       Pain Loc --      Pain Edu? --      Excl. in [place]? --    No data found.  Updated Vital Signs BP (!) 192/105 (BP Location: Right Arm)   Pulse (!) 52   Temp 98.3 F (36.8 C) (Oral)   Resp 18   SpO2 99%   Physical Exam Constitutional:      General: She is not in acute distress.    Appearance: Normal appearance. She is well-developed. She is not toxic-appearing or diaphoretic.  HENT:     Head: Normocephalic and atraumatic.  Eyes:     Conjunctiva/sclera: Conjunctivae normal.     Pupils: Pupils are equal, round, and reactive to light.  Pulmonary:     Effort: Pulmonary effort is normal. No respiratory distress.     Comments: Speaking in full sentences without difficulty Musculoskeletal:     Cervical back: Normal range of motion and neck supple.  Skin:    General: Skin is warm and dry.     Comments: See picture below. No erythema, warmth. No tenderness to palpation.   Neurological:     Mental Status: She is alert and oriented to person, place, and time.          UC Treatments / Results  Labs (all labs ordered are listed, but only abnormal results are displayed) Labs Reviewed - No data to display  EKG   Radiology No results found.  Procedures Procedures (including critical care time)  Medications Ordered in UC Medications - No data to display  Initial Impression / Assessment and Plan / UC Course  I have reviewed the triage vital signs and the nursing notes.  Pertinent labs & imaging results that were available during my care of the patient were reviewed by me and considered in my medical decision making (see chart for details).    Restart BP medications. Restart triamcinolone cream during the day. Triamcinolone ointment at night. SARNA, antihistamine for itching. Follow up with PCP as scheduled for reevaluation.  Final Clinical Impressions(s) / UC Diagnoses   Final diagnoses:  Rash    ED Prescriptions    Medication Sig Dispense Auth. Provider    triamcinolone (KENALOG) 0.025 % ointment Apply  1 application topically 2 (two) times daily. 30 g Caitlinn Klinker V, PA-C   triamcinolone cream (KENALOG) 0.1 % Apply 1 application topically 2 (two) times daily. 30 g Hilmer Aliberti V, PA-C   camphor-menthol [facility]) lotion Apply 1 application topically as needed for itching. 222 mL Ok Edwards, PA-C     PDMP not reviewed this encounter.   Ok Edwards, PA-C 01/07/20 [phone]

## 2020-01-07 NOTE — Discharge Instructions (Signed)
Restart your BP medicines. Get over the counter zyrtec and start daily. SARNA for itching. Use triamcinolone cream (same as before) twice a day while awake. Apply triamcinolone ointment at night time and wrap with plastic wrap. Follow up with PCP as scheduled for further evaluation needed.

## 2020-01-07 NOTE — ED Triage Notes (Addendum)
Pt here for rash to ankles and generalized itching x months; pt sts thought was from BP meds and was switched to new meds but still having rash; pt stopped taking bp meds 2 weeks

## 2020-01-12 ENCOUNTER — Telehealth (INDEPENDENT_AMBULATORY_CARE_PROVIDER_SITE_OTHER): Payer: No Typology Code available for payment source | Admitting: Internal Medicine

## 2020-01-12 ENCOUNTER — Encounter: Payer: Self-pay | Admitting: Internal Medicine

## 2020-01-12 DIAGNOSIS — I10 Essential (primary) hypertension: Secondary | ICD-10-CM

## 2020-01-12 DIAGNOSIS — R21 Rash and other nonspecific skin eruption: Secondary | ICD-10-CM | POA: Diagnosis not present

## 2020-01-12 MED ORDER — LOSARTAN POTASSIUM 50 MG PO TABS: 50.0000 mg | ORAL_TABLET | Freq: Every day | ORAL | 0 refills | Status: DC

## 2020-01-12 MED FILL — LOSARTAN POTASSIUM 50 MG TA: 50 | 90 days supply | Qty: 90 | Fill #0

## 2020-01-12 NOTE — Progress Notes (Signed)
Virtual Visit via Telephone Note  I connected with Amy Schultz, on 01/12/2020 at 10:46 AM by telephone due to the COVID-19 pandemic and verified that I am speaking with the correct person using two identifiers.   Consent: I discussed the limitations, risks, security and privacy concerns of performing an evaluation and management service by telephone and the availability of in person appointments. I also discussed with the patient that there may be a patient responsible charge related to this service. The patient expressed understanding and agreed to proceed.   Location of Patient: Home   Location of Provider: Clinic    Persons participating in Telemedicine visit: Rickea Schutz [facility] Dr. Juleen China      History of Present Illness: Patient has concerns about rash present to her ankles. Has been present for several months, she had ED visit on 3/10 and 5/14 for these concerns. She just started Triamcinolone cream for this after recent urgent care visit. She reports it is starting to clear up.   Patient reports that she stopped her HCTZ about 2 weeks due to concern that the medication was the etiology of her rash. She was told at urgent care that she should restart her BP medication as her BP was 192/105. She is monitoring her BP at home, reports that her BPs have ranged from 120-160/70-100. Patient reports that she was told at last visit to stop her Losartan and she was previously only taking HCTZ.    Past Medical History:  Diagnosis Date  . Hypertension   . Postmenopausal bleeding   . Rash    both ankles x 3 weeks prescribed hydroxyazine prn and kenalog cream at urgent care 11-03-2019  . Thyroid disease    Allergies  Allergen Reactions  . Codeine Nausea Only  . Darvocet [Propoxyphene N-Acetaminophen] Nausea Only  . Demerol Nausea Only  . Hydrocodone Nausea Only  . Penicillins Nausea Only    Current Outpatient Medications on File Prior to Visit  Medication Sig  Dispense Refill  . aspirin 81 MG chewable tablet Chew by mouth daily.    . camphor-menthol (SARNA) lotion Apply 1 application topically as needed for itching. 222 mL 0  . triamcinolone (KENALOG) 0.025 % ointment Apply 1 application topically 2 (two) times daily. 30 g 0  . hydrochlorothiazide (HYDRODIURIL) 25 MG tablet Take 1 tablet (25 mg total) by mouth daily. (Patient not taking: Reported on 01/07/2020) 90 tablet 3  . [DISCONTINUED] losartan (COZAAR) 50 MG tablet Take 1 tablet (50 mg total) by mouth daily. 30 tablet 0   No current facility-administered medications on file prior to visit.    Observations/Objective: NAD. Speaking clearly.  Work of breathing normal.  Alert and oriented. Mood appropriate.   Assessment and Plan: 1. Rash and nonspecific skin eruption Suspect related to contact dermatitis vs. ?eczema given location and nature of rash. Continue steroid cream.   2. Essential hypertension Discussed with patient that rash associated with HCTZ would typically be more diffuse as its related to T cell mediated process and can be a photodermatitis in sun exposed skin. Encouraged continuation of HCTZ. Additionally, it seems that Losartan has somehow fallen off patient's medication list. Given uncontrolled BP trend at prior visits, will have patient restart. Continue to monitor at home. Return within one month for follow up on BP and for BMET monitoring with restart of medication.  - losartan (COZAAR) 50 MG tablet; Take 1 tablet (50 mg total) by mouth daily.  Dispense: 90 tablet; Refill: 0  Follow Up Instructions: One month for HTN    I discussed the assessment and treatment plan with the patient. The patient was provided an opportunity to ask questions and all were answered. The patient agreed with the plan and demonstrated an understanding of the instructions.   The patient was advised to call back or seek an in-person evaluation if the symptoms worsen or if the condition fails to  improve as anticipated.     I provided 18 minutes total of non-face-to-face time during this encounter including median intraservice time, reviewing previous notes, investigations, ordering medications, medical decision making, coordinating care and patient verbalized understanding at the end of the visit.    Phill Myron, D.O. Primary Care at [facility]  01/12/2020, 10:46 AM

## 2020-02-11 ENCOUNTER — Telehealth: Payer: Self-pay

## 2020-02-11 NOTE — Telephone Encounter (Signed)
Called patient to do their pre-visit COVID screening.  Call went to voicemail. Unable to do prescreening.  

## 2020-02-14 ENCOUNTER — Encounter: Payer: Self-pay | Admitting: Internal Medicine

## 2020-02-14 ENCOUNTER — Ambulatory Visit (INDEPENDENT_AMBULATORY_CARE_PROVIDER_SITE_OTHER): Payer: No Typology Code available for payment source | Admitting: Internal Medicine

## 2020-02-14 ENCOUNTER — Other Ambulatory Visit: Payer: Self-pay

## 2020-02-14 VITALS — BP 171/94 | HR 65 | Temp 97.3°F | Resp 17 | Wt 260.0 lb

## 2020-02-14 DIAGNOSIS — I10 Essential (primary) hypertension: Secondary | ICD-10-CM

## 2020-02-14 DIAGNOSIS — E039 Hypothyroidism, unspecified: Secondary | ICD-10-CM | POA: Diagnosis not present

## 2020-02-14 DIAGNOSIS — R7303 Prediabetes: Secondary | ICD-10-CM | POA: Diagnosis not present

## 2020-02-14 DIAGNOSIS — Z1231 Encounter for screening mammogram for malignant neoplasm of breast: Secondary | ICD-10-CM

## 2020-02-14 DIAGNOSIS — E038 Other specified hypothyroidism: Secondary | ICD-10-CM | POA: Insufficient documentation

## 2020-02-14 MED ORDER — HYDROCHLOROTHIAZIDE 25 MG PO TABS: 25.0000 mg | ORAL_TABLET | Freq: Every day | ORAL | 3 refills | Status: DC

## 2020-02-14 MED ORDER — LOSARTAN POTASSIUM 50 MG PO TABS: 50.0000 mg | ORAL_TABLET | Freq: Every day | ORAL | 0 refills | Status: DC

## 2020-02-14 MED ORDER — TRIAMCINOLONE ACETONIDE 0.1 % EX OINT: 1.0000 "application " | TOPICAL_OINTMENT | Freq: Two times a day (BID) | CUTANEOUS | 0 refills | Status: DC

## 2020-02-14 MED FILL — HYDROCHLOROTHIAZIDE 25 MG T: 25 | 90 days supply | Qty: 90 | Fill #0

## 2020-02-14 MED FILL — TRIAMCINOLONE 0.1% OINTMEN: 0.1 | 14 days supply | Qty: 30 | Fill #0

## 2020-02-14 NOTE — Progress Notes (Signed)
Subjective:    Amy Schultz - 64 y.o. female MRN [id number]  Date of birth: 1955/10/02  HPI  Amy Schultz is here for follow up of HTN.  Chronic HTN Disease Monitoring:  Home BP Monitoring - Monitors at work. Lowest systolic has been in 329J. Typically up in 160s.  Chest pain- no  Dyspnea- no Headache - no  Medications: Losartan 50 mg, HCTZ 25 mg  Compliance- no, did not realize she was supposed to restart the HCTZ Lightheadedness- no  Edema- no    Health Maintenance:  Health Maintenance Due  Topic Date Due  . COVID-19 Vaccine (1) Never done  . TETANUS/TDAP  Never done  . MAMMOGRAM  01/19/2020    -  reports that she has never smoked. She has never used smokeless tobacco. - Review of Systems: Per HPI. - Past Medical History: Patient Active Problem List   Diagnosis Date Noted  . Elevated TSH 10/01/2019  . Prediabetes 09/30/2019  . Candidate for statin therapy due to risk of future cardiovascular event 09/30/2019  . Hypertension 11/07/2011   - Medications: reviewed and updated   Objective:   Physical Exam BP (!) 171/94   Pulse 65   Temp (!) 97.3 F (36.3 C) (Temporal)   Resp 17   Wt 260 lb (117.9 kg)   SpO2 96%   BMI 37.31 kg/m  Physical Exam Constitutional:      General: She is not in acute distress.    Appearance: She is not diaphoretic.  HENT:     Head: Normocephalic and atraumatic.  Eyes:     Conjunctiva/sclera: Conjunctivae normal.  Cardiovascular:     Rate and Rhythm: Normal rate and regular rhythm.     Heart sounds: Normal heart sounds. No murmur heard.   Pulmonary:     Effort: Pulmonary effort is normal. No respiratory distress.     Breath sounds: Normal breath sounds.  Musculoskeletal:        General: Normal range of motion.  Skin:    General: Skin is warm and dry.  Neurological:     Mental Status: She is alert and oriented to person, place, and time.  Psychiatric:        Mood and Affect: Affect normal.         Judgment: Judgment normal.         Assessment & Plan:   1. Essential hypertension BP above goal here and on outside of office readings. Discussed restarting HCTZ at last office visit; however, there was so miscommunication and patient is only taking Losartan so not particularly surprising that we have not improved BP control. Advised to continue Losartan and sent in new Rx for HCTZ. Will check BMET. Return in 4 weeks.  Counseled on blood pressure goal of less than 130/80, low-sodium, DASH diet, medication compliance, 150 minutes of moderate intensity exercise per week. Discussed medication compliance, adverse effects. - Basic metabolic panel - hydrochlorothiazide (HYDRODIURIL) 25 MG tablet; Take 1 tablet (25 mg total) by mouth daily.  Dispense: 90 tablet; Refill: 3 - losartan (COZAAR) 50 MG tablet; Take 1 tablet (50 mg total) by mouth daily.  Dispense: 90 tablet; Refill: 0  2. Breast cancer screening by mammogram - MM Digital Screening; Future  3. Prediabetes A1c 6.0 in Dec 2020. Will repeat to ensure stable.  - Hemoglobin A1c  4. Subclinical hypothyroidism TSH was mildly elevated in Dec 2020 with normal T3 and T4. Will repeat lab studies.  - TSH+T4F+T3Free     Phill Myron,  D.O. 02/14/2020, 2:18 PM Primary Care at [facility]

## 2020-02-15 LAB — BASIC METABOLIC PANEL
BUN/Creatinine Ratio: 14 (ref 12–28)
BUN: 12 mg/dL (ref 8–27)
CO2: 24 mmol/L (ref 20–29)
Calcium: 8.8 mg/dL (ref 8.7–10.3)
Chloride: 104 mmol/L (ref 96–106)
Creatinine, Ser: 0.85 mg/dL (ref 0.57–1.00)
GFR calc Af Amer: 85 mL/min/{1.73_m2} (ref >59)
GFR calc non Af Amer: 74 mL/min/{1.73_m2} (ref >59)
Glucose: 88 mg/dL (ref 65–99)
Potassium: 4.4 mmol/L (ref 3.5–5.2)
Sodium: 142 mmol/L (ref 134–144)

## 2020-02-15 LAB — TSH+T4F+T3FREE
Free T4: 0.89 ng/dL (ref 0.82–1.77)
T3, Free: 2.4 pg/mL (ref 2.0–4.4)
TSH: 3.52 u[IU]/mL (ref 0.450–4.500)

## 2020-02-15 LAB — HEMOGLOBIN A1C
Est. average glucose Bld gHb Est-mCnc: 123 mg/dL
Hgb A1c MFr Bld: 5.9 % — ABNORMAL HIGH (ref 4.8–5.6)

## 2020-02-25 NOTE — Progress Notes (Signed)
Normal lab letter mailed.

## 2020-03-01 ENCOUNTER — Other Ambulatory Visit: Payer: Self-pay

## 2020-03-01 ENCOUNTER — Ambulatory Visit
Admission: RE | Admit: 2020-03-01 | Discharge: 2020-03-01 | Disposition: A | Discharge status: Home / Self Care | Payer: No Typology Code available for payment source | Source: Ambulatory Visit | Attending: Internal Medicine | Admitting: Internal Medicine

## 2020-03-01 DIAGNOSIS — Z1231 Encounter for screening mammogram for malignant neoplasm of breast: Secondary | ICD-10-CM

## 2020-04-10 MED FILL — LOSARTAN POTASSIUM 50 MG TA: 50 | 90 days supply | Qty: 90 | Fill #0

## 2020-05-22 MED FILL — HYDROCHLOROTHIAZIDE 25 MG T: 25 | 90 days supply | Qty: 90 | Fill #1

## 2020-07-28 ENCOUNTER — Other Ambulatory Visit: Payer: Self-pay | Admitting: Internal Medicine

## 2020-07-28 DIAGNOSIS — I10 Essential (primary) hypertension: Secondary | ICD-10-CM

## 2020-07-31 ENCOUNTER — Other Ambulatory Visit: Payer: Self-pay | Admitting: Internal Medicine

## 2020-07-31 MED FILL — LOSARTAN POTASSIUM 50 MG TA: 50 | 30 days supply | Qty: 30 | Fill #0

## 2020-08-31 ENCOUNTER — Other Ambulatory Visit: Payer: Self-pay | Admitting: Internal Medicine

## 2020-08-31 DIAGNOSIS — I10 Essential (primary) hypertension: Secondary | ICD-10-CM

## 2020-09-01 ENCOUNTER — Other Ambulatory Visit: Payer: Self-pay | Admitting: Family

## 2020-09-01 ENCOUNTER — Other Ambulatory Visit: Payer: Self-pay

## 2020-09-01 MED FILL — LOSARTAN POTASSIUM 50 MG TA: 50 | 30 days supply | Qty: 30 | Fill #0

## 2020-09-01 NOTE — Telephone Encounter (Signed)
Please call patient with update.   Please schedule appointment with primary physician soon for additional refills and management. Last appointment June 2021. Will refill with 1 month supply for now.

## 2020-09-06 MED FILL — HYDROCHLOROTHIAZIDE 25 MG T: 25 | 90 days supply | Qty: 90 | Fill #2

## 2020-09-19 ENCOUNTER — Encounter: Payer: Self-pay | Admitting: Physician Assistant

## 2020-09-19 ENCOUNTER — Other Ambulatory Visit: Payer: Self-pay

## 2020-09-19 ENCOUNTER — Other Ambulatory Visit: Payer: Self-pay | Admitting: Physician Assistant

## 2020-09-19 ENCOUNTER — Ambulatory Visit (INDEPENDENT_AMBULATORY_CARE_PROVIDER_SITE_OTHER): Payer: No Typology Code available for payment source | Admitting: Physician Assistant

## 2020-09-19 VITALS — BP 154/92 | HR 84 | Temp 98.2°F | Resp 18 | Ht 71.0 in | Wt 251.0 lb

## 2020-09-19 DIAGNOSIS — R7303 Prediabetes: Secondary | ICD-10-CM | POA: Diagnosis not present

## 2020-09-19 DIAGNOSIS — R42 Dizziness and giddiness: Secondary | ICD-10-CM

## 2020-09-19 DIAGNOSIS — I10 Essential (primary) hypertension: Secondary | ICD-10-CM

## 2020-09-19 DIAGNOSIS — E038 Other specified hypothyroidism: Secondary | ICD-10-CM | POA: Diagnosis not present

## 2020-09-19 LAB — POCT GLYCOSYLATED HEMOGLOBIN (HGB A1C): Hemoglobin A1C: 6.2 % — AB (ref 4.0–5.6)

## 2020-09-19 MED ORDER — HYDROCHLOROTHIAZIDE 50 MG PO TABS
50.0000 mg | ORAL_TABLET | Freq: Every day | ORAL | 1 refills | Status: DC
Start: 2020-09-19 — End: 2020-09-19

## 2020-09-19 MED ORDER — LOSARTAN POTASSIUM 50 MG PO TABS: 50.0000 mg | ORAL_TABLET | Freq: Every day | ORAL | 1 refills | Status: DC

## 2020-09-19 NOTE — Patient Instructions (Addendum)
Work on Academic librarian intake, you should drink at least 64 ounces of water a day and begin watching your sodium intake   I am increasing your hydrochlorothiazide to 50 mg, make sure that you are taking both blood pressure medications.  I encourage you to check your blood pressure on a daily basis, keep a written log and bring that with you to your next office visit in 4 weeks.  We will call you with your lab results  Amy Rad, PA-C Physician Assistant [facility] [email or web address]      DASH Eating Plan DASH stands for Dietary Approaches to Stop Hypertension. The DASH eating plan is a healthy eating plan that has been shown to:  Reduce high blood pressure (hypertension).  Reduce your risk for type 2 diabetes, heart disease, and stroke.  Help with weight loss. What are tips for following this plan? Reading food labels  Check food labels for the amount of salt (sodium) per serving. Choose foods with less than 5 percent of the Daily Value of sodium. Generally, foods with less than 300 milligrams (mg) of sodium per serving fit into this eating plan.  To find whole grains, look for the word "whole" as the first word in the ingredient list. Shopping  Buy products labeled as "low-sodium" or "no salt added."  Buy fresh foods. Avoid canned foods and pre-made or frozen meals. Cooking  Avoid adding salt when cooking. Use salt-free seasonings or herbs instead of table salt or sea salt. Check with your health care provider or pharmacist before using salt substitutes.  Do not fry foods. Cook foods using healthy methods such as baking, boiling, grilling, roasting, and broiling instead.  Cook with heart-healthy oils, such as olive, canola, avocado, soybean, or sunflower oil. Meal planning  Eat a balanced diet that includes: ? 4 or more servings of fruits and 4 or more servings of vegetables each day. Try to fill one-half  of your plate with fruits and vegetables. ? 6-8 servings of whole grains each day. ? Less than 6 oz (170 g) of lean meat, poultry, or fish each day. A 3-oz (85-g) serving of meat is about the same size as a deck of cards. One egg equals 1 oz (28 g). ? 2-3 servings of low-fat dairy each day. One serving is 1 cup (237 mL). ? 1 serving of nuts, seeds, or beans 5 times each week. ? 2-3 servings of heart-healthy fats. Healthy fats called omega-3 fatty acids are found in foods such as walnuts, flaxseeds, fortified milks, and eggs. These fats are also found in cold-water fish, such as sardines, salmon, and mackerel.  Limit how much you eat of: ? Canned or prepackaged foods. ? Food that is high in trans fat, such as some fried foods. ? Food that is high in saturated fat, such as fatty meat. ? Desserts and other sweets, sugary drinks, and other foods with added sugar. ? Full-fat dairy products.  Do not salt foods before eating.  Do not eat more than 4 egg yolks a week.  Try to eat at least 2 vegetarian meals a week.  Eat more home-cooked food and less restaurant, buffet, and fast food.   Lifestyle  When eating at a restaurant, ask that your food be prepared with less salt or no salt, if possible.  If you drink alcohol: ? Limit how much you use to:  0-1 drink a day for women who are not pregnant.  0-2 drinks a day for men. ?  Be aware of how much alcohol is in your drink. In the [place] one drink equals one 12 oz bottle of beer (355 mL), one 5 oz glass of wine (148 mL), or one 1 oz glass of hard liquor (44 mL). General information  Avoid eating more than 2,300 mg of salt a day. If you have hypertension, you may need to reduce your sodium intake to 1,500 mg a day.  Work with your health care provider to maintain a healthy body weight or to lose weight. Ask what an ideal weight is for you.  Get at least 30 minutes of exercise that causes your heart to beat faster (aerobic exercise) most days  of the week. Activities may include walking, swimming, or biking.  Work with your health care provider or dietitian to adjust your eating plan to your individual calorie needs. What foods should I eat? Fruits All fresh, dried, or frozen fruit. Canned fruit in natural juice (without added sugar). Vegetables Fresh or frozen vegetables (raw, steamed, roasted, or grilled). Low-sodium or reduced-sodium tomato and vegetable juice. Low-sodium or reduced-sodium tomato sauce and tomato paste. Low-sodium or reduced-sodium canned vegetables. Grains Whole-grain or whole-wheat bread. Whole-grain or whole-wheat pasta. Brown rice. Orpah Cobb. Bulgur. Whole-grain and low-sodium cereals. Pita bread. Low-fat, low-sodium crackers. Whole-wheat flour tortillas. Meats and other proteins Skinless chicken or [place]. Ground chicken or [place]. Pork with fat trimmed off. Fish and seafood. Egg whites. Dried beans, peas, or lentils. Unsalted nuts, nut butters, and seeds. Unsalted canned beans. Lean cuts of beef with fat trimmed off. Low-sodium, lean precooked or cured meat, such as sausages or meat loaves. Dairy Low-fat (1%) or fat-free (skim) milk. Reduced-fat, low-fat, or fat-free cheeses. Nonfat, low-sodium ricotta or cottage cheese. Low-fat or nonfat yogurt. Low-fat, low-sodium cheese. Fats and oils Soft margarine without trans fats. Vegetable oil. Reduced-fat, low-fat, or light mayonnaise and salad dressings (reduced-sodium). Canola, safflower, olive, avocado, soybean, and sunflower oils. Avocado. Seasonings and condiments Herbs. Spices. Seasoning mixes without salt. Other foods Unsalted popcorn and pretzels. Fat-free sweets. The items listed above may not be a complete list of foods and beverages you can eat. Contact a dietitian for more information. What foods should I avoid? Fruits Canned fruit in a light or heavy syrup. Fried fruit. Fruit in cream or butter sauce. Vegetables Creamed or fried vegetables.  Vegetables in a cheese sauce. Regular canned vegetables (not low-sodium or reduced-sodium). Regular canned tomato sauce and paste (not low-sodium or reduced-sodium). Regular tomato and vegetable juice (not low-sodium or reduced-sodium). Rosita Fire. Olives. Grains Baked goods made with fat, such as croissants, muffins, or some breads. Dry pasta or rice meal packs. Meats and other proteins Fatty cuts of meat. Ribs. Fried meat. Tomasa Blase. Bologna, salami, and other precooked or cured meats, such as sausages or meat loaves. Fat from the back of a pig (fatback). Bratwurst. Salted nuts and seeds. Canned beans with added salt. Canned or smoked fish. Whole eggs or egg yolks. Chicken or [place] with skin. Dairy Whole or 2% milk, cream, and half-and-half. Whole or full-fat cream cheese. Whole-fat or sweetened yogurt. Full-fat cheese. Nondairy creamers. Whipped toppings. Processed cheese and cheese spreads. Fats and oils Butter. Stick margarine. Lard. Shortening. Ghee. Bacon fat. Tropical oils, such as coconut, palm kernel, or palm oil. Seasonings and condiments Onion salt, garlic salt, seasoned salt, table salt, and sea salt. Worcestershire sauce. Tartar sauce. Barbecue sauce. Teriyaki sauce. Soy sauce, including reduced-sodium. Steak sauce. Canned and packaged gravies. Fish sauce. Oyster sauce. Cocktail sauce. Store-bought horseradish. Ketchup. Mustard.  Meat flavorings and tenderizers. Bouillon cubes. Hot sauces. Pre-made or packaged marinades. Pre-made or packaged taco seasonings. Relishes. Regular salad dressings. Other foods Salted popcorn and pretzels. The items listed above may not be a complete list of foods and beverages you should avoid. Contact a dietitian for more information. Where to find more information  National Heart, Lung, and Blood Institute: https://wilson-eaton.com/  American Heart Association: www.heart.org  Academy of Nutrition and Dietetics: www.eatright.[place]:  www.kidney.org Summary  The DASH eating plan is a healthy eating plan that has been shown to reduce high blood pressure (hypertension). It may also reduce your risk for type 2 diabetes, heart disease, and stroke.  When on the DASH eating plan, aim to eat more fresh fruits and vegetables, whole grains, lean proteins, low-fat dairy, and heart-healthy fats.  With the DASH eating plan, you should limit salt (sodium) intake to 2,300 mg a day. If you have hypertension, you may need to reduce your sodium intake to 1,500 mg a day.  Work with your health care provider or dietitian to adjust your eating plan to your individual calorie needs. This information is not intended to replace advice given to you by your health care provider. Make sure you discuss any questions you have with your health care provider. Document Revised: 07/16/2019 Document Reviewed: 07/16/2019 Elsevier Patient Education  2021 [employer].

## 2020-09-19 NOTE — Progress Notes (Signed)
Patient has not taken medication today, patient has been taking medication at 6 pm prior to work for the past week. Patient has eaten a banana today. Patient denies pain at this time.

## 2020-09-19 NOTE — Progress Notes (Signed)
Established Patient Office Visit  Subjective:  Patient ID: Amy Schultz, female    DOB: March 03, 1956  Age: 65 y.o. MRN: [id number]  CC:  Chief Complaint  Patient presents with  . Hypertension    HPI VELA RENDER reports that she will check her blood pressure on occasion while working.  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  Reports that she generally will have elevated blood pressure readings despite taking both blood pressure medications consistently.  Reports readings approximately 148/90.  Reports an episode at work yesterday where she felt dizzy, did not check her blood pressure and it was elevated 190/100, states she rested had blood pressure came down to approximately 160/90.  Reports that she does not follow a low-sodium diet, states that she drinks approximately 1-2 bottles of water a day.   Past Medical History:  Diagnosis Date  . Hypertension   . Postmenopausal bleeding   . Rash    both ankles x 3 weeks prescribed hydroxyazine prn and kenalog cream at urgent care 11-03-2019  . Thyroid disease     Past Surgical History:  Procedure Laterality Date  . COLONOSCOPY    . DILATATION & CURETTAGE/HYSTEROSCOPY WITH MYOSURE N/A 11/10/2019   Procedure: DILATATION & CURETTAGE/HYSTEROSCOPY WITH MYOSURE;  Surgeon: Servando Salina, MD;  Location: [facility];  Service: Gynecology;  Laterality: N/A;  . THYROID SURGERY     didn't remove thyroid  . TUBAL LIGATION      Family History  Problem Relation Age of Onset  . Diabetes Mother   . Hypertension Mother   . Hypertension Sister   . Breast cancer Sister        unsure age of onset  . Breast cancer Maternal Aunt        unsure age of onset  . Diabetes Maternal Grandmother   . Colon cancer Neg Hx   . Esophageal cancer Neg Hx   . Rectal cancer Neg Hx   . Stomach cancer Neg Hx   . Colon polyps Neg Hx     Social History   Socioeconomic History  . Marital status: Single    Spouse name:  Not on file  . Number of children: 3  . Years of education: Not on file  . Highest education level: Not on file  Occupational History  . Not on file  Tobacco Use  . Smoking status: Never Smoker  . Smokeless tobacco: Never Used  Vaping Use  . Vaping Use: Never used  Substance and Sexual Activity  . Alcohol use: No  . Drug use: No  . Sexual activity: Not Currently    Birth control/protection: Surgical  Other Topics Concern  . Not on file  Social History Narrative  . Not on file   Social Determinants of Health   Financial Resource Strain: Not on file  Food Insecurity: Not on file  Transportation Needs: Not on file  Physical Activity: Not on file  Stress: Not on file  Social Connections: Not on file  Intimate Partner Violence: Not on file    Outpatient Medications Prior to Visit  Medication Sig Dispense Refill  . aspirin 81 MG chewable tablet Chew by mouth daily.    Marland Kitchen triamcinolone ointment (KENALOG) 0.1 % Apply 1 application topically 2 (two) times daily. 30 g 0  . hydrochlorothiazide (HYDRODIURIL) 25 MG tablet Take 1 tablet (25 mg total) by mouth daily. 90 tablet 3  . losartan (COZAAR) 50 MG tablet TAKE 1  TABLET (50 MG TOTAL) BY MOUTH DAILY. 30 tablet 0   No facility-administered medications prior to visit.    Allergies  Allergen Reactions  . Codeine Nausea Only  . Darvocet [Propoxyphene N-Acetaminophen] Nausea Only  . Demerol Nausea Only  . Hydrocodone Nausea Only  . Penicillins Nausea Only    ROS Review of Systems  Constitutional: Negative.   HENT: Negative.   Eyes: Negative.   Respiratory: Negative.   Cardiovascular: Negative for chest pain and palpitations.  Gastrointestinal: Negative.   Endocrine: Negative.   Genitourinary: Negative.   Musculoskeletal: Negative.   Skin: Negative.   Allergic/Immunologic: Negative.   Neurological: Positive for dizziness and light-headedness. Negative for seizures, syncope, speech difficulty and weakness.   Hematological: Negative.   Psychiatric/Behavioral: Negative.       Objective:    Physical Exam Vitals reviewed.   BP (!) 154/92 (BP Location: Right Arm, Patient Position: Sitting, Cuff Size: Large)   Pulse 84   Temp 98.2 F (36.8 C) (Oral)   Resp 18   Ht 5\' 11"  (1.803 m)   Wt 251 lb (113.9 kg)   SpO2 95%   BMI 35.01 kg/m   General Appearance:    Alert, cooperative, no distress, appears stated age  Head:    Normocephalic, without obvious abnormality, atraumatic  Eyes:    PERRL, conjunctiva/corneas clear, EOM's intact, fundi    benign, both eyes  Ears:    Normal TM's and external ear canals, both ears  Nose:   Nares normal, septum midline, mucosa normal, no drainage    or sinus tenderness  Throat:   Lips, mucosa, and tongue normal; teeth and gums normal  Neck:   Supple, symmetrical, trachea midline, no adenopathy;    thyroid:  no enlargement/tenderness/nodules; no carotid   bruit or JVD  Back:     Symmetric, no curvature, ROM normal, no CVA tenderness  Lungs:     Clear to auscultation bilaterally, respirations unlabored  Chest Wall:    No tenderness or deformity   Heart:    Regular rate and rhythm, S1 and S2 normal, no murmur, rub   or gallop              Extremities:   Extremities normal, atraumatic, no cyanosis or edema  Pulses:   2+ and symmetric all extremities  Skin:   Skin color, texture, turgor normal, no rashes or lesions  Lymph nodes:   Cervical, supraclavicular, and axillary nodes normal  Neurologic:   CNII-XII intact, normal strength, sensation and reflexes    throughout     BP (!) 154/92 (BP Location: Right Arm, Patient Position: Sitting, Cuff Size: Large)   Pulse 84   Temp 98.2 F (36.8 C) (Oral)   Resp 18   Ht 5\' 11"  (1.803 m)   Wt 251 lb (113.9 kg)   SpO2 95%   BMI 35.01 kg/m  Wt Readings from Last 3 Encounters:  09/19/20 251 lb (113.9 kg)  02/14/20 260 lb (117.9 kg)  11/10/19 254 lb 12.8 oz (115.6 kg)     There are no preventive care  reminders to display for this patient.  There are no preventive care reminders to display for this patient.  Lab Results  Component Value Date   TSH 3.520 02/14/2020   Lab Results  Component Value Date   WBC 6.9 11/10/2019   HGB 12.4 11/10/2019   HCT 39.3 11/10/2019   MCV 81.9 11/10/2019   PLT 253 11/10/2019   Lab Results  Component Value Date  NA 142 02/14/2020   K 4.4 02/14/2020   CO2 24 02/14/2020   GLUCOSE 88 02/14/2020   BUN 12 02/14/2020   CREATININE 0.85 02/14/2020   BILITOT <0.2 08/17/2019   ALKPHOS 81 08/17/2019   AST 16 08/17/2019   ALT 16 08/17/2019   PROT 7.4 08/17/2019   ALBUMIN 4.4 08/17/2019   CALCIUM 8.8 02/14/2020   ANIONGAP 9 11/10/2019   Lab Results  Component Value Date   CHOL 144 08/17/2019   Lab Results  Component Value Date   HDL 50 08/17/2019   Lab Results  Component Value Date   LDLCALC 80 08/17/2019   Lab Results  Component Value Date   TRIG 67 08/17/2019   Lab Results  Component Value Date   CHOLHDL 2.9 08/17/2019   Lab Results  Component Value Date   HGBA1C 6.2 (A) 09/19/2020      Assessment & Plan:   Problem List Items Addressed This Visit      Cardiovascular and Mediastinum   Essential hypertension - Primary   Relevant Medications   hydrochlorothiazide (HYDRODIURIL) 50 MG tablet   losartan (COZAAR) 50 MG tablet   Other Relevant Orders   Comp. Metabolic Panel (12)     Other   Prediabetes   Relevant Orders   HgB A1c (Completed)   Dizziness and giddiness   Relevant Medications   hydrochlorothiazide (HYDRODIURIL) 50 MG tablet   Other Relevant Orders   CBC with Differential/Platelet   TSH    1. Essential hypertension Increase hydrochlorothiazide 50 mg.  Patient education given on low-sodium diet, encouraged to check blood pressure on a daily basis, keep a written log and bring to all office visits.  4-week appointment made to follow-up with PCP - Comp. Metabolic Panel (12) - hydrochlorothiazide  (HYDRODIURIL) 50 MG tablet; Take 1 tablet (50 mg total) by mouth daily.  Dispense: 30 tablet; Refill: 1 - losartan (COZAAR) 50 MG tablet; Take 1 tablet (50 mg total) by mouth daily.  Dispense: 30 tablet; Refill: 1  2. Prediabetes A1c 6.2, patient has had A1c continue to elevate throughout the past year.  Patient refused trial of Metformin, patient education given on diabetic diet, lifestyle modifications - HgB A1c  3. Dizziness and giddiness Encouraged to increase hydration, work on lifestyle modifications  Red flags given for prompt reevaluation and ED evaluation - CBC with Differential/Platelet - TSH   I have reviewed the patient's medical history (PMH, PSH, Social History, Family History, Medications, and allergies) , and have been updated if relevant. I spent 30 minutes reviewing chart and  face to face time with patient.     Meds ordered this encounter  Medications  . hydrochlorothiazide (HYDRODIURIL) 50 MG tablet    Sig: Take 1 tablet (50 mg total) by mouth daily.    Dispense:  30 tablet    Refill:  1    Dose change    Order Specific Question:   Supervising Provider    Answer:   Asencion Noble E [[id number]]  . losartan (COZAAR) 50 MG tablet    Sig: Take 1 tablet (50 mg total) by mouth daily.    Dispense:  30 tablet    Refill:  1    Order Specific Question:   Supervising Provider    Answer:   Elsie Stain [[id number]]    Follow-up: Return in about 4 weeks (around 10/17/2020).    Loraine Grip Mayers, PA-C

## 2020-09-20 ENCOUNTER — Telehealth: Payer: Self-pay | Admitting: *Deleted

## 2020-09-20 LAB — CBC WITH DIFFERENTIAL/PLATELET
Basophils Absolute: 0 10*3/uL (ref 0.0–0.2)
Basos: 1 %
EOS (ABSOLUTE): 0.1 10*3/uL (ref 0.0–0.4)
Eos: 2 %
Hematocrit: 39.3 % (ref 34.0–46.6)
Hemoglobin: 12.8 g/dL (ref 11.1–15.9)
Immature Grans (Abs): 0 10*3/uL (ref 0.0–0.1)
Immature Granulocytes: 0 %
Lymphocytes Absolute: 2.7 10*3/uL (ref 0.7–3.1)
Lymphs: 43 %
MCH: 26.1 pg — ABNORMAL LOW (ref 26.6–33.0)
MCHC: 32.6 g/dL (ref 31.5–35.7)
MCV: 80 fL (ref 79–97)
Monocytes Absolute: 0.3 10*3/uL (ref 0.1–0.9)
Monocytes: 4 %
Neutrophils Absolute: 3.1 10*3/uL (ref 1.4–7.0)
Neutrophils: 50 %
Platelets: 275 10*3/uL (ref 150–450)
RBC: 4.9 x10E6/uL (ref 3.77–5.28)
RDW: 14.7 % (ref 11.7–15.4)
WBC: 6.3 10*3/uL (ref 3.4–10.8)

## 2020-09-20 LAB — COMP. METABOLIC PANEL (12)
AST: 15 IU/L (ref 0–40)
Albumin/Globulin Ratio: 1.6 (ref 1.2–2.2)
Albumin: 4.4 g/dL (ref 3.8–4.8)
Alkaline Phosphatase: 70 IU/L (ref 44–121)
BUN/Creatinine Ratio: 10 — ABNORMAL LOW (ref 12–28)
BUN: 8 mg/dL (ref 8–27)
Bilirubin Total: 0.2 mg/dL (ref 0.0–1.2)
Calcium: 8.8 mg/dL (ref 8.7–10.3)
Chloride: 101 mmol/L (ref 96–106)
Creatinine, Ser: 0.79 mg/dL (ref 0.57–1.00)
GFR calc Af Amer: 93 mL/min/{1.73_m2} (ref >59)
GFR calc non Af Amer: 80 mL/min/{1.73_m2} (ref >59)
Globulin, Total: 2.8 g/dL (ref 1.5–4.5)
Glucose: 137 mg/dL — ABNORMAL HIGH (ref 65–99)
Potassium: 3.9 mmol/L (ref 3.5–5.2)
Sodium: 139 mmol/L (ref 134–144)
Total Protein: 7.2 g/dL (ref 6.0–8.5)

## 2020-09-20 LAB — TSH: TSH: 6.39 u[IU]/mL — ABNORMAL HIGH (ref 0.450–4.500)

## 2020-09-20 NOTE — Telephone Encounter (Signed)
Patient verified DOB Patient is aware of kidney and liver function being normal along with not being anemic. Patient is aware of needing additional labs regarding her abnormal TSH. Patient is scheduled for 09/21/20 at 9:00 for lab visit.

## 2020-09-20 NOTE — Addendum Note (Signed)
Addended by: Kennieth Rad on: 09/20/2020 01:13 PM   Modules accepted: Orders

## 2020-09-20 NOTE — Telephone Encounter (Signed)
-----   Message from Kennieth Rad, [place] sent at 09/20/2020  1:13 PM EST ----- Please call patient and let her know that her kidney and liver function are within normal limits, she does not show signs of anemia.  Her thyroid function is once again abnormal, she does need to return for a follow-up lab for further review

## 2020-09-21 ENCOUNTER — Other Ambulatory Visit: Payer: Self-pay

## 2020-09-21 ENCOUNTER — Other Ambulatory Visit: Payer: No Typology Code available for payment source

## 2020-09-21 DIAGNOSIS — E038 Other specified hypothyroidism: Secondary | ICD-10-CM

## 2020-09-22 LAB — THYROID PANEL WITH TSH
Free Thyroxine Index: 1.9 (ref 1.2–4.9)
T3 Uptake Ratio: 27 % (ref 24–39)
T4, Total: 7 ug/dL (ref 4.5–12.0)
TSH: 3.36 u[IU]/mL (ref 0.450–4.500)

## 2020-09-24 ENCOUNTER — Telehealth: Payer: Self-pay | Admitting: *Deleted

## 2020-09-24 NOTE — Telephone Encounter (Signed)
-----   Message from Kennieth Rad, [place] sent at 09/24/2020  9:22 AM EST ----- Please let patient know that her thyroid levels are back within normal limits. We will continue to monitor

## 2020-09-24 NOTE — Telephone Encounter (Signed)
Patient verified DOB Patient is aware of TSH being normal and continued to be monitored. No concerns.

## 2020-10-11 MED FILL — LOSARTAN POTASSIUM 50 MG TA: 50 | 30 days supply | Qty: 30 | Fill #0

## 2020-10-18 ENCOUNTER — Ambulatory Visit: Payer: No Typology Code available for payment source | Admitting: Internal Medicine

## 2020-10-25 ENCOUNTER — Other Ambulatory Visit (HOSPITAL_COMMUNITY): Payer: Self-pay

## 2020-10-25 MED FILL — HYDROCHLOROTHIAZIDE 50 MG T: 50 | 30 days supply | Qty: 30 | Fill #0

## 2020-10-25 MED FILL — OMRON 3 SERIES BP MONITOR D: 30 days supply | Qty: 1 | Fill #0

## 2020-10-27 ENCOUNTER — Ambulatory Visit: Payer: No Typology Code available for payment source | Admitting: Family Medicine

## 2020-11-30 ENCOUNTER — Other Ambulatory Visit (HOSPITAL_COMMUNITY): Payer: Self-pay

## 2020-11-30 ENCOUNTER — Other Ambulatory Visit: Payer: Self-pay | Admitting: Physician Assistant

## 2020-11-30 DIAGNOSIS — I10 Essential (primary) hypertension: Secondary | ICD-10-CM

## 2020-11-30 MED FILL — Hydrochlorothiazide Tab 50 MG: ORAL | 30 days supply | Qty: 30 | Fill #0 | Status: AC

## 2020-12-01 ENCOUNTER — Other Ambulatory Visit (HOSPITAL_COMMUNITY): Payer: Self-pay

## 2020-12-01 MED ORDER — LOSARTAN POTASSIUM 50 MG PO TABS
50.0000 mg | ORAL_TABLET | Freq: Every day | ORAL | 1 refills | Status: DC
  Filled 2020-12-01: qty 30, 30d supply, fill #0
  Filled 2021-01-11: qty 30, 30d supply, fill #1

## 2020-12-04 ENCOUNTER — Other Ambulatory Visit (HOSPITAL_COMMUNITY): Payer: Self-pay

## 2021-01-11 ENCOUNTER — Other Ambulatory Visit: Payer: Self-pay | Admitting: Physician Assistant

## 2021-01-11 ENCOUNTER — Other Ambulatory Visit (HOSPITAL_COMMUNITY): Payer: Self-pay

## 2021-01-11 DIAGNOSIS — I10 Essential (primary) hypertension: Secondary | ICD-10-CM

## 2021-01-12 ENCOUNTER — Other Ambulatory Visit (HOSPITAL_COMMUNITY): Payer: Self-pay

## 2021-01-12 MED ORDER — HYDROCHLOROTHIAZIDE 50 MG PO TABS
50.0000 mg | ORAL_TABLET | Freq: Every day | ORAL | 1 refills | Status: DC
  Filled 2021-01-12: qty 30, 30d supply, fill #0
  Filled 2021-02-19: qty 30, 30d supply, fill #1

## 2021-02-16 ENCOUNTER — Other Ambulatory Visit: Payer: Self-pay | Admitting: Obstetrics and Gynecology

## 2021-02-16 DIAGNOSIS — Z1231 Encounter for screening mammogram for malignant neoplasm of breast: Secondary | ICD-10-CM

## 2021-02-19 ENCOUNTER — Other Ambulatory Visit (HOSPITAL_COMMUNITY): Payer: Self-pay

## 2021-02-19 ENCOUNTER — Other Ambulatory Visit: Payer: Self-pay | Admitting: Physician Assistant

## 2021-02-19 DIAGNOSIS — I10 Essential (primary) hypertension: Secondary | ICD-10-CM

## 2021-02-20 ENCOUNTER — Other Ambulatory Visit (HOSPITAL_COMMUNITY): Payer: Self-pay

## 2021-02-20 ENCOUNTER — Other Ambulatory Visit: Payer: Self-pay | Admitting: Physician Assistant

## 2021-02-20 DIAGNOSIS — I10 Essential (primary) hypertension: Secondary | ICD-10-CM

## 2021-02-21 ENCOUNTER — Other Ambulatory Visit (HOSPITAL_COMMUNITY): Payer: Self-pay

## 2021-02-22 ENCOUNTER — Other Ambulatory Visit (HOSPITAL_COMMUNITY): Payer: Self-pay

## 2021-02-22 ENCOUNTER — Telehealth: Payer: Self-pay | Admitting: Internal Medicine

## 2021-02-22 MED ORDER — LOSARTAN POTASSIUM 50 MG PO TABS
50.0000 mg | ORAL_TABLET | Freq: Every day | ORAL | 0 refills | Status: DC
  Filled 2021-02-22: qty 30, 30d supply, fill #0

## 2021-02-22 NOTE — Telephone Encounter (Signed)
Pt called stating she needs refill on her medication  losartan (COZAAR) 50 MG tablet    Pharmacy  [facility]  [address], [place] [address]  Phone:  [phone]  Fax:  [phone]  DEA #:  [id number]

## 2021-03-20 ENCOUNTER — Other Ambulatory Visit (HOSPITAL_COMMUNITY): Payer: Self-pay

## 2021-03-20 ENCOUNTER — Other Ambulatory Visit: Payer: Self-pay | Admitting: Physician Assistant

## 2021-03-20 DIAGNOSIS — I10 Essential (primary) hypertension: Secondary | ICD-10-CM

## 2021-03-21 ENCOUNTER — Other Ambulatory Visit (HOSPITAL_COMMUNITY): Payer: Self-pay

## 2021-03-22 ENCOUNTER — Other Ambulatory Visit (HOSPITAL_COMMUNITY): Payer: Self-pay

## 2021-03-23 ENCOUNTER — Other Ambulatory Visit (HOSPITAL_COMMUNITY): Payer: Self-pay

## 2021-03-23 MED ORDER — LOSARTAN POTASSIUM 50 MG PO TABS
50.0000 mg | ORAL_TABLET | Freq: Every day | ORAL | 0 refills | Status: DC
  Filled 2021-03-23: qty 30, 30d supply, fill #0

## 2021-04-12 ENCOUNTER — Ambulatory Visit: Payer: No Typology Code available for payment source

## 2021-04-23 ENCOUNTER — Ambulatory Visit
Admission: RE | Admit: 2021-04-23 | Discharge: 2021-04-23 | Disposition: A | Discharge status: Home / Self Care | Payer: No Typology Code available for payment source | Source: Ambulatory Visit | Attending: Obstetrics and Gynecology | Admitting: Obstetrics and Gynecology

## 2021-04-23 ENCOUNTER — Other Ambulatory Visit: Payer: Self-pay

## 2021-04-23 DIAGNOSIS — Z1231 Encounter for screening mammogram for malignant neoplasm of breast: Secondary | ICD-10-CM

## 2021-05-01 ENCOUNTER — Encounter: Payer: Self-pay | Admitting: Nurse Practitioner

## 2021-05-01 ENCOUNTER — Ambulatory Visit (INDEPENDENT_AMBULATORY_CARE_PROVIDER_SITE_OTHER): Payer: No Typology Code available for payment source | Admitting: Nurse Practitioner

## 2021-05-01 ENCOUNTER — Other Ambulatory Visit (HOSPITAL_COMMUNITY): Payer: Self-pay

## 2021-05-01 ENCOUNTER — Other Ambulatory Visit: Payer: Self-pay

## 2021-05-01 DIAGNOSIS — R21 Rash and other nonspecific skin eruption: Secondary | ICD-10-CM

## 2021-05-01 DIAGNOSIS — I10 Essential (primary) hypertension: Secondary | ICD-10-CM | POA: Diagnosis not present

## 2021-05-01 MED ORDER — LOSARTAN POTASSIUM 50 MG PO TABS
50.0000 mg | ORAL_TABLET | Freq: Every day | ORAL | 0 refills | Status: DC
  Filled 2021-05-01: qty 30, 30d supply, fill #0

## 2021-05-01 MED ORDER — PREDNISONE 20 MG PO TABS
20.0000 mg | ORAL_TABLET | Freq: Every day | ORAL | 0 refills | Status: AC
  Filled 2021-05-01: qty 5, 5d supply, fill #0

## 2021-05-01 NOTE — Progress Notes (Signed)
Virtual Visit via Telephone Note  I connected with Amy Schultz on 05/01/21 at  8:50 AM EDT by telephone and verified that I am speaking with the correct person using two identifiers.  Location: Patient: home Provider: office   I discussed the limitations, risks, security and privacy concerns of performing an evaluation and management service by telephone and the availability of in person appointments. I also discussed with the patient that there may be a patient responsible charge related to this service. The patient expressed understanding and agreed to proceed.   History of Present Illness:  Patient presents today for a televisit for medication refill.  This was a previous patient of Dr. Juleen China.  Patient does have an appointment scheduled to establish care with Dr. Redmond Pulling on May 15, 2021.  Patient is aware of this appointment.  Patient states that today she needs her losartan refilled.  She states that she is compliant with losartan and HCTZ.  She states that she has been spot checking her blood pressures at work and states that they have been elevated.  She has not been keeping a record.  We discussed that it would be beneficial if over the next couple weeks she kept a blood pressure log to bring to her next appointment with Dr. Redmond Pulling.  Patient also complains today of an ongoing rash.  She states that the rash is located on her ankles.  She has tried Kenalog cream that was prescribed in June by Dr. Juleen China.  She states that this is provided minimal relief.  We discussed that it is hard to assess over a telephone visit but we can trial a low-dose course of prednisone and she can try hydrocortisone cream as well.  We will follow-up on this at her next visit with Dr. Redmond Pulling.   Denies f/c/s, n/v/d, hemoptysis, PND, chest pain or edema     Observations/Objective:  Vitals with BMI 09/19/2020 02/14/2020 01/07/2020  Height '5\' 11"'$  - -  Weight 251 lbs 260 lbs -  BMI [id number] - -  Systolic  [id number]  Diastolic 92 94 [id number]  Pulse 84 65 52      Assessment and Plan:  Medication Refill Hypertension:   Continue hydrochlorothiazide 50 mg.    Patient education given on low-sodium diet  Encouraged to check blood pressure on a daily basis, keep a written log and bring to all office visits.   Will refill losartan (COZAAR) 50 MG tablet; Take 1 tablet (50 mg total) by mouth daily.  Dispense: 30 tablet; Refill: 0  Rash lower extremities:  Will order prednisone  May try OTC hydrocortisone cream   Follow up:  Follow up as scheduled with Dr. Redmond Pulling    I discussed the assessment and treatment plan with the patient. The patient was provided an opportunity to ask questions and all were answered. The patient agreed with the plan and demonstrated an understanding of the instructions.   The patient was advised to call back or seek an in-person evaluation if the symptoms worsen or if the condition fails to improve as anticipated.  I provided 23 minutes of non-face-to-face time during this encounter.   Fenton Foy, NP

## 2021-05-01 NOTE — Patient Instructions (Addendum)
Medication Refill Hypertension:   Continue hydrochlorothiazide 50 mg.    Patient education given on low-sodium diet  Encouraged to check blood pressure on a daily basis, keep a written log and bring to all office visits.   Will refill losartan (COZAAR) 50 MG tablet; Take 1 tablet (50 mg total) by mouth daily.  Dispense: 30 tablet; Refill: 0  Rash lower extremities:  Will order prednisone  May try OTC hydrocortisone cream   Follow up:  Follow up as scheduled with Dr. Redmond Pulling  Rash, Adult A rash is a change in the color of your skin. A rash can also change the way your skin feels. There are many different conditions and factors that can cause a rash. Some rashes may disappear after a few days, but some may last for a few weeks. Common causes of rashes include: Viral infections, such as: Colds. Measles. Hand, foot, and mouth disease. Bacterial infections, such as: Scarlet fever. Impetigo. Fungal infections, such as Candida. Allergic reactions to food, medicines, or skin care products. Follow these instructions at home: The goal of treatment is to stop the itching and keep the rash from spreading. Pay attention to any changes in your symptoms. Follow these instructions to help with your condition: Medicine Take or apply over-the-counter and prescription medicines only as told by your health care provider. These may include: Corticosteroid creams to treat red or swollen skin. Anti-itch lotions. Oral allergy medicines (antihistamines). Oral corticosteroids for severe symptoms.  Skin care Apply cool compresses to the affected areas. Do not scratch or rub your skin. Avoid covering the rash. Make sure the rash is exposed to air as much as possible. Managing itching and discomfort Avoid hot showers or baths, which can make itching worse. A cold shower may help. Try taking a bath with: Epsom salts. Follow manufacturer instructions on the packaging. You can get these at your  local pharmacy or grocery store. Baking soda. Pour a small amount into the bath as told by your health care provider. Colloidal oatmeal. Follow manufacturer instructions on the packaging. You can get this at your local pharmacy or grocery store. Try applying baking soda paste to your skin. Stir water into baking soda until it reaches a paste-like consistency. Try applying calamine lotion. This is an over-the-counter lotion that helps to relieve itchiness. Keep cool and out of the sun. Sweating and being hot can make itching worse. General instructions  Rest as needed. Drink enough fluid to keep your urine pale yellow. Wear loose-fitting clothing. Avoid scented soaps, detergents, and perfumes. Use gentle soaps, detergents, perfumes, and other cosmetic products. Avoid any substance that causes your rash. Keep a journal to help track what causes your rash. Write down: What you eat. What cosmetic products you use. What you drink. What you wear. This includes jewelry. Keep all follow-up visits as told by your health care provider. This is important. Contact a health care provider if: You sweat at night. You lose weight. You urinate more than normal. You urinate less than normal, or you notice that your urine is a darker color than usual. You feel weak. You vomit. Your skin or the whites of your eyes look yellow (jaundice). Your skin: Tingles. Is numb. Your rash: Does not go away after several days. Gets worse. You are: Unusually thirsty. More tired than normal. You have: New symptoms. Pain in your abdomen. A fever. Diarrhea. Get help right away if you: Have a fever and your symptoms suddenly get worse. Develop confusion. Have a severe  headache or a stiff neck. Have severe joint pains or stiffness. Have a seizure. Develop a rash that covers all or most of your body. The rash may or may not be painful. Develop blisters that: Are on top of the rash. Grow larger or grow  together. Are painful. Are inside your nose or mouth. Develop a rash that: Looks like purple pinprick-sized spots all over your body. Has a "bull's eye" or looks like a target. Is not related to sun exposure, is red and painful, and causes your skin to peel. Summary A rash is a change in the color of your skin. Some rashes disappear after a few days, but some may last for a few weeks. The goal of treatment is to stop the itching and keep the rash from spreading. Take or apply over-the-counter and prescription medicines only as told by your health care provider. Contact a health care provider if you have new or worsening symptoms. Keep all follow-up visits as told by your health care provider. This is important. This information is not intended to replace advice given to you by your health care provider. Make sure you discuss any questions you have with your health care provider. Document Revised: 12/04/2018 Document Reviewed: 03/16/2018 Elsevier Patient Education  2022 [place] With Less [employer] with less salt is one way to reduce the amount of sodium you get from food. Sodium is one of the elements that make up salt. It is found naturally in foods and is also added to certain foods. Depending on your condition and overall health, your health care provider or dietitian may recommend that you reduce your sodium intake. Most people should have less than 2,300 milligrams (mg) of sodium each day. If you have high blood pressure (hypertension), you may need to limit your sodium to 1,500 mg each day. Follow the tips below to help reduce your sodium intake. What are tips for eating less sodium? Reading food labels  Check the food label before buying or using packaged ingredients. Always check the label for the serving size and sodium content. Look for products with no more than 140 mg of sodium in one serving. Check the % Daily Value column to see what percent of the daily  recommended amount of sodium is provided in one serving of the product. Foods with 5% or less in this column are considered low in sodium. Foods with 20% or higher are considered high in sodium. Do not choose foods with salt as one of the first three ingredients on the ingredients list. If salt is one of the first three ingredients, it usually means the item is high in sodium. Shopping Buy sodium-free or low-sodium products. Look for the following words on food labels: Low-sodium. Sodium-free. Reduced-sodium. No salt added. Unsalted. Always check the sodium content even if foods are labeled as low-sodium or no salt added. Buy fresh foods. Cooking Use herbs, seasonings without salt, and spices as substitutes for salt. Use sodium-free baking soda when baking. Grill, braise, or roast foods to add flavor with less salt. Avoid adding salt to pasta, rice, or hot cereals. Drain and rinse canned vegetables, beans, and meat before use. Avoid adding salt when cooking sweets and desserts. Cook with low-sodium ingredients. What foods are high in sodium? Vegetables Regular canned vegetables (not low-sodium or reduced-sodium). Sauerkraut, pickled vegetables, and relishes. Olives. [place] fries. Onion rings. Regular canned tomato sauce and paste. Regular tomato and vegetable juice. Frozen vegetables in sauces. Grains Instant hot  cereals. Bread stuffing, pancake, and biscuit mixes. Croutons. Seasoned rice or pasta mixes. Noodle soup cups. Boxed or frozen macaroni and cheese. Regular salted crackers. Self-rising flour. Rolls. Bagels. Flour tortillas and wraps. Meats and other proteins Meat or fish that is salted, canned, smoked, cured, spiced, or pickled. This includes bacon, ham, sausages, hot dogs, corned beef, chipped beef, meat loaves, salt pork, jerky, pickled herring, anchovies, regular canned tuna, and sardines. Salted nuts. Dairy Processed cheese and cheese spreads. Cheese curds. Blue cheese. Feta  cheese. String cheese. Regular cottage cheese. Buttermilk. Canned milk. The items listed above may not be a complete list of foods high in sodium. Actual amounts of sodium may be different depending on processing. Contact a dietitian for more information. What foods are low in sodium? Fruits Fresh, frozen, or canned fruit with no sauce added. Fruit juice. Vegetables Fresh or frozen vegetables with no sauce added. "No salt added" canned vegetables. "No salt added" tomato sauce and paste. Low-sodium or reduced-sodium tomato and vegetable juice. Grains Noodles, pasta, quinoa, rice. Shredded or puffed wheat or puffed rice. Regular or quick oats (not instant). Low-sodium crackers. Low-sodium bread. Whole-grain bread and whole-grain pasta. Unsalted popcorn. Meats and other proteins Fresh or frozen whole meats, poultry (not injected with sodium), and fish with no sauce added. Unsalted nuts. Dried peas, beans, and lentils without added salt. Unsalted canned beans. Eggs. Unsalted nut butters. Low-sodium canned tuna or chicken. Dairy Milk. Soy milk. Yogurt. Low-sodium cheeses, such as Swiss, Monterey Jack, [place], and [employer]. Sherbet or ice cream (keep to  cup per serving). Cream cheese. Fats and oils Unsalted butter or margarine. Other foods Homemade pudding. Sodium-free baking soda and baking powder. Herbs and spices. Low-sodium seasoning mixes. Beverages Coffee and tea. Carbonated beverages. The items listed above may not be a complete list of foods low in sodium. Actual amounts of sodium may be different depending on processing. Contact a dietitian for more information. What are some salt alternatives when cooking? The following are herbs, seasonings, and spices that can be used instead of salt to flavor your food. Herbs should be fresh or dried. Do not choose packaged mixes. Next to the name of the herb, spice, or seasoning are some examples of foods you can pair it with. Herbs [place] leaves -  Soups, meat and vegetable dishes, and spaghetti sauce. Basil - [employer], soups, pasta, and fish dishes. Cilantro - Meat, poultry, and vegetable dishes. Chili powder - Marinades and Mexican dishes. Chives - Salad dressings and potato dishes. Cumin - Mexican dishes, couscous, and meat dishes. Dill - Fish dishes, sauces, and salads. Fennel - Meat and vegetable dishes, breads, and cookies. Garlic (do not use garlic salt) - [place] dishes, meat dishes, salad dressings, and sauces. Marjoram - Soups, potato dishes, and meat dishes. Oregano - Pizza and spaghetti sauce. Parsley - Salads, soups, pasta, and meat dishes. Rosemary - [place] dishes, salad dressings, soups, and red meats. Saffron - Fish dishes, pasta, and some poultry dishes. Sage - Stuffings and sauces. Tarragon - Fish and [employer]. Thyme - Stuffing, meat, and fish dishes. Seasonings Lemon juice - Fish dishes, poultry dishes, vegetables, and salads. Vinegar - Salad dressings, vegetables, and fish dishes. Spices Cinnamon - Sweet dishes, such as cakes, cookies, and puddings. Cloves - Gingerbread, puddings, and marinades for meats. Curry - Vegetable dishes, fish and poultry dishes, and stir-fry dishes. Ginger - Vegetable dishes, fish dishes, and stir-fry dishes. Nutmeg - Pasta, vegetables, poultry, fish dishes, and custard. Summary Cooking with less salt  is one way to reduce the amount of sodium that you get from food. Buy sodium-free or low-sodium products. Check the food label before using or buying packaged ingredients. Use herbs, seasonings without salt, and spices as substitutes for salt in foods. This information is not intended to replace advice given to you by your health care provider. Make sure you discuss any questions you have with your health care provider. Document Revised: 08/04/2019 Document Reviewed: 08/04/2019 Elsevier Patient Education  [place].

## 2021-05-02 ENCOUNTER — Other Ambulatory Visit: Payer: Self-pay | Admitting: Nurse Practitioner

## 2021-05-02 ENCOUNTER — Other Ambulatory Visit (HOSPITAL_COMMUNITY): Payer: Self-pay

## 2021-05-02 DIAGNOSIS — I10 Essential (primary) hypertension: Secondary | ICD-10-CM

## 2021-05-03 ENCOUNTER — Telehealth: Payer: Self-pay | Admitting: Internal Medicine

## 2021-05-03 ENCOUNTER — Other Ambulatory Visit: Payer: Self-pay | Admitting: Nurse Practitioner

## 2021-05-03 ENCOUNTER — Other Ambulatory Visit (HOSPITAL_COMMUNITY): Payer: Self-pay

## 2021-05-03 ENCOUNTER — Other Ambulatory Visit: Payer: Self-pay

## 2021-05-03 MED FILL — Hydrochlorothiazide Tab 50 MG: ORAL | 30 days supply | Qty: 30 | Fill #0 | Status: AC

## 2021-05-03 NOTE — Telephone Encounter (Signed)
Hello,  Could you assist with this refill. I tried to send it, but it says that it is pending.  Thanks, Lazaro Arms, FNP-C

## 2021-05-03 NOTE — Telephone Encounter (Signed)
Refill completed for Hydrochlorothiazide 50 mg

## 2021-05-03 NOTE — Telephone Encounter (Signed)
Patient called in stating she had a telephone appointment on 05/01/2021 and she needs another medication called in   hydrochlorothiazide (HYDRODIURIL) 50 MG tablet    [facility]  [address], [place] [address]  Phone:  [phone]  Fax:  [phone]  DEA #:  [id number]

## 2021-05-04 ENCOUNTER — Other Ambulatory Visit (HOSPITAL_COMMUNITY): Payer: Self-pay

## 2021-05-04 ENCOUNTER — Other Ambulatory Visit: Payer: Self-pay | Admitting: Nurse Practitioner

## 2021-05-04 DIAGNOSIS — I10 Essential (primary) hypertension: Secondary | ICD-10-CM

## 2021-05-04 MED ORDER — HYDROCHLOROTHIAZIDE 50 MG PO TABS: 50.0000 mg | ORAL_TABLET | Freq: Every day | ORAL | 1 refills | Status: DC

## 2021-05-04 MED ORDER — HYDROCHLOROTHIAZIDE 50 MG PO TABS
50.0000 mg | ORAL_TABLET | Freq: Every day | ORAL | 1 refills | Status: DC
  Filled 2021-05-04: qty 30, 30d supply, fill #0

## 2021-05-15 ENCOUNTER — Ambulatory Visit (INDEPENDENT_AMBULATORY_CARE_PROVIDER_SITE_OTHER): Payer: No Typology Code available for payment source | Admitting: Family Medicine

## 2021-05-15 ENCOUNTER — Other Ambulatory Visit (HOSPITAL_COMMUNITY): Payer: Self-pay

## 2021-05-15 ENCOUNTER — Encounter: Payer: Self-pay | Admitting: Family Medicine

## 2021-05-15 ENCOUNTER — Other Ambulatory Visit: Payer: Self-pay

## 2021-05-15 VITALS — BP 169/101 | HR 67 | Temp 98.2°F | Resp 16 | Ht 71.0 in | Wt 257.2 lb

## 2021-05-15 DIAGNOSIS — I10 Essential (primary) hypertension: Secondary | ICD-10-CM

## 2021-05-15 DIAGNOSIS — R21 Rash and other nonspecific skin eruption: Secondary | ICD-10-CM

## 2021-05-15 MED ORDER — LOSARTAN POTASSIUM 100 MG PO TABS
100.0000 mg | ORAL_TABLET | Freq: Every day | ORAL | 0 refills | Status: DC
  Filled 2021-05-15: qty 90, 90d supply, fill #0

## 2021-05-15 MED ORDER — HYDROCHLOROTHIAZIDE 25 MG PO TABS
25.0000 mg | ORAL_TABLET | Freq: Every day | ORAL | 0 refills | Status: DC
  Filled 2021-05-15: qty 90, 90d supply, fill #0

## 2021-05-15 NOTE — Progress Notes (Signed)
Patient is here for follow-up HTN, establish care Patient c/o rash on legs that has been present for awhile.

## 2021-05-15 NOTE — Progress Notes (Signed)
Established Patient Office Visit  Subjective:  Patient ID: ATTICUS LEMBERGER, female    DOB: Jul 01, 1956  Age: 65 y.o. MRN: [id number]  CC:  Chief Complaint  Patient presents with   Establish Care   Follow-up   Rash    HPI ALONI CHUANG presents for to establish care and concern about elevated blood pressures and a rash on her legs. Patient is presently on meds for her blood pressure including losartan and HCTZ.   Past Medical History:  Diagnosis Date   Hypertension    Postmenopausal bleeding    Rash    both ankles x 3 weeks prescribed hydroxyazine prn and kenalog cream at urgent care 11-03-2019   Thyroid disease     Past Surgical History:  Procedure Laterality Date   COLONOSCOPY     DILATATION & CURETTAGE/HYSTEROSCOPY WITH MYOSURE N/A 11/10/2019   Procedure: [place];  Surgeon: Servando Salina, MD;  Location: [facility];  Service: Gynecology;  Laterality: N/A;   THYROID SURGERY     didn't remove thyroid   TUBAL LIGATION      Family History  Problem Relation Age of Onset   Diabetes Mother    Hypertension Mother    Hypertension Sister    Breast cancer Sister        unsure age of onset   Breast cancer Maternal Aunt        unsure age of onset   Diabetes Maternal Grandmother    Colon cancer Neg Hx    Esophageal cancer Neg Hx    Rectal cancer Neg Hx    Stomach cancer Neg Hx    Colon polyps Neg Hx     Social History   Socioeconomic History   Marital status: Single    Spouse name: Not on file   Number of children: 3   Years of education: Not on file   Highest education level: Not on file  Occupational History   Not on file  Tobacco Use   Smoking status: Never   Smokeless tobacco: Never  Vaping Use   Vaping Use: Never used  Substance and Sexual Activity   Alcohol use: No   Drug use: No   Sexual activity: Not Currently    Birth control/protection: Surgical  Other Topics Concern   Not on file  Social  History Narrative   Not on file   Social Determinants of Health   Financial Resource Strain: Not on file  Food Insecurity: Not on file  Transportation Needs: Not on file  Physical Activity: Not on file  Stress: Not on file  Social Connections: Not on file  Intimate Partner Violence: Not on file    ROS Review of Systems  Cardiovascular:  Negative for chest pain.  Skin:  Positive for rash.  All other systems reviewed and are negative.  Objective:   Today's Vitals: BP (!) 169/101 (BP Location: Left Arm, Patient Position: Sitting, Cuff Size: Large)   Pulse 67   Temp 98.2 F (36.8 C) (Oral)   Resp 16   Ht 5' 11"  (1.803 m)   Wt 257 lb 3.2 oz (116.7 kg)   SpO2 97%   BMI 35.87 kg/m   Physical Exam Vitals and nursing note reviewed.  Constitutional:      General: She is not in acute distress. Cardiovascular:     Rate and Rhythm: Normal rate and regular rhythm.  Pulmonary:     Effort: Pulmonary effort is normal.     Breath  sounds: Normal breath sounds.  Abdominal:     Palpations: Abdomen is soft.     Tenderness: There is no abdominal tenderness.  Musculoskeletal:     Right lower leg: No edema.     Left lower leg: No edema.  Skin:    Findings: Rash present.     Comments: Hyperpigmention bilateral anterior shins  Neurological:     General: No focal deficit present.     Mental Status: She is alert and oriented to person, place, and time.    Assessment & Plan:  1. Uncontrolled hypertension Will increase losartan from 50 mg to 100 mg daily and decrease HCTZ from 50 mg to 25 mg daily and monitor. Monitoring labs ordered - CMP14+EGFR - Lipid Panel  2. Rash and nonspecific skin eruption ?mild stasis dermatitis -patient to try support hose and keeping skin well oiled - will monitor  Outpatient Encounter Medications as of 05/15/2021  Medication Sig   aspirin 81 MG chewable tablet Chew by mouth daily.   Blood Pressure Monitoring (OMRON 3 SERIES BP MONITOR) DEVI USE AS  DIRECTED   hydrochlorothiazide (HYDRODIURIL) 50 MG tablet Take 1 tablet (50 mg total) by mouth daily.   losartan (COZAAR) 50 MG tablet Take 1 tablet (50 mg total) by mouth daily.   triamcinolone ointment (KENALOG) 0.1 % Apply 1 application topically 2 (two) times daily.   No facility-administered encounter medications on file as of 05/15/2021.    Follow-up: Return in about 2 weeks (around 05/29/2021) for follow up.   Becky Sax, MD

## 2021-05-16 ENCOUNTER — Other Ambulatory Visit (HOSPITAL_COMMUNITY): Payer: Self-pay

## 2021-05-16 LAB — CMP14+EGFR
ALT: 17 IU/L (ref 0–32)
AST: 17 IU/L (ref 0–40)
Albumin/Globulin Ratio: 1.4 (ref 1.2–2.2)
Albumin: 4.3 g/dL (ref 3.8–4.8)
Alkaline Phosphatase: 73 IU/L (ref 44–121)
BUN/Creatinine Ratio: 11 — ABNORMAL LOW (ref 12–28)
BUN: 8 mg/dL (ref 8–27)
Bilirubin Total: 0.5 mg/dL (ref 0.0–1.2)
CO2: 24 mmol/L (ref 20–29)
Calcium: 9.3 mg/dL (ref 8.7–10.3)
Chloride: 100 mmol/L (ref 96–106)
Creatinine, Ser: 0.75 mg/dL (ref 0.57–1.00)
Globulin, Total: 3.1 g/dL (ref 1.5–4.5)
Glucose: 93 mg/dL (ref 65–99)
Potassium: 4 mmol/L (ref 3.5–5.2)
Sodium: 141 mmol/L (ref 134–144)
Total Protein: 7.4 g/dL (ref 6.0–8.5)
eGFR: 89 mL/min/{1.73_m2} (ref >59)

## 2021-05-16 LAB — LIPID PANEL
Chol/HDL Ratio: 3.2 ratio (ref 0.0–4.4)
Cholesterol, Total: 179 mg/dL (ref 100–199)
HDL: 56 mg/dL (ref >39)
LDL Chol Calc (NIH): 104 mg/dL — ABNORMAL HIGH (ref 0–99)
Triglycerides: 104 mg/dL (ref 0–149)
VLDL Cholesterol Cal: 19 mg/dL (ref 5–40)

## 2021-06-05 ENCOUNTER — Encounter: Payer: Self-pay | Admitting: Family Medicine

## 2021-06-05 ENCOUNTER — Ambulatory Visit (INDEPENDENT_AMBULATORY_CARE_PROVIDER_SITE_OTHER): Payer: No Typology Code available for payment source | Admitting: Family Medicine

## 2021-06-05 ENCOUNTER — Other Ambulatory Visit: Payer: Self-pay

## 2021-06-05 VITALS — BP 162/95 | HR 67 | Resp 16 | Wt 256.4 lb

## 2021-06-05 DIAGNOSIS — I10 Essential (primary) hypertension: Secondary | ICD-10-CM | POA: Diagnosis not present

## 2021-06-05 DIAGNOSIS — R21 Rash and other nonspecific skin eruption: Secondary | ICD-10-CM | POA: Diagnosis not present

## 2021-06-05 MED ORDER — TRIAMCINOLONE ACETONIDE 0.1 % EX CREA
1.0000 "application " | TOPICAL_CREAM | Freq: Two times a day (BID) | CUTANEOUS | 0 refills | Status: DC
  Filled 2021-06-06: qty 45, 25d supply, fill #0

## 2021-06-05 MED ORDER — AMLODIPINE BESYLATE 10 MG PO TABS
10.0000 mg | ORAL_TABLET | Freq: Every day | ORAL | 0 refills | Status: DC
  Filled 2021-06-06: qty 90, 90d supply, fill #0

## 2021-06-05 NOTE — Progress Notes (Signed)
Patient is here for follow-up blood pressure.  Patient has no other concerns today

## 2021-06-05 NOTE — Progress Notes (Signed)
Established  Patient Office Visit  Subjective:  Patient ID: Amy Schultz, female    DOB: February 20, 1956  Age: 65 y.o. MRN: [id number]  CC:  Chief Complaint  Patient presents with   Hypertension    HPI Amy Schultz presents for follow up of hypertension. Patient denies acute complaints or concerns. Reports taking meds as recommended.  Past Medical History:  Diagnosis Date   Hypertension    Postmenopausal bleeding    Rash    both ankles x 3 weeks prescribed hydroxyazine prn and kenalog cream at urgent care 11-03-2019   Thyroid disease       Social History   Socioeconomic History   Marital status: Single    Spouse name: Not on file   Number of children: 3   Years of education: Not on file   Highest education level: Not on file  Occupational History   Not on file  Tobacco Use   Smoking status: Never   Smokeless tobacco: Never  Vaping Use   Vaping Use: Never used  Substance and Sexual Activity   Alcohol use: No   Drug use: No   Sexual activity: Not Currently    Birth control/protection: Surgical  Other Topics Concern   Not on file  Social History Narrative   Not on file   Social Determinants of Health   Financial Resource Strain: Not on file  Food Insecurity: Not on file  Transportation Needs: Not on file  Physical Activity: Not on file  Stress: Not on file  Social Connections: Not on file  Intimate Partner Violence: Not on file    ROS Review of Systems  All other systems reviewed and are negative.  Objective:   Today's Vitals: BP (!) 162/95   Pulse 67   Resp 16   Wt 256 lb 6.4 oz (116.3 kg)   SpO2 95%   BMI 35.76 kg/m   Physical Exam Vitals and nursing note reviewed.  Constitutional:      General: She is not in acute distress. Cardiovascular:     Rate and Rhythm: Normal rate and regular rhythm.  Pulmonary:     Effort: Pulmonary effort is normal.     Breath sounds: Normal breath sounds.  Abdominal:     Palpations: Abdomen is soft.      Tenderness: There is no abdominal tenderness.  Musculoskeletal:     Right lower leg: No edema.     Left lower leg: No edema.  Skin:    Findings: Rash present.     Comments: Hyperpigmention bilateral anterior shins  Neurological:     General: No focal deficit present.     Mental Status: She is alert and oriented to person, place, and time.    Assessment & Plan:    1. Uncontrolled hypertension Elevated readings persist. Will add amlodipine 10 mg daily to regimen and monitor  2. Rash and nonspecific skin eruption Kenalog cream prescribed. Continue support hose/socks   Outpatient Encounter Medications as of 06/05/2021  Medication Sig   amLODipine (NORVASC) 10 MG tablet Take 1 tablet (10 mg total) by mouth daily.   aspirin 81 MG chewable tablet Chew by mouth daily.   Blood Pressure Monitoring (OMRON 3 SERIES BP MONITOR) DEVI USE AS DIRECTED   hydrochlorothiazide (HYDRODIURIL) 25 MG tablet Take 1 tablet (25 mg total) by mouth daily.   losartan (COZAAR) 100 MG tablet Take 1 tablet (100 mg total) by mouth daily.   [DISCONTINUED] triamcinolone ointment (KENALOG) 0.1 % Apply 1 application topically  2 (two) times daily.   No facility-administered encounter medications on file as of 06/05/2021.    Follow-up: Return in about 4 weeks (around 07/03/2021) for follow up, chronic med issues.   Becky Sax, MD

## 2021-06-06 ENCOUNTER — Other Ambulatory Visit (HOSPITAL_COMMUNITY): Payer: Self-pay

## 2021-06-21 ENCOUNTER — Ambulatory Visit: Payer: No Typology Code available for payment source | Admitting: Family Medicine

## 2021-06-25 ENCOUNTER — Encounter: Payer: Self-pay | Admitting: Family Medicine

## 2021-06-25 ENCOUNTER — Ambulatory Visit (INDEPENDENT_AMBULATORY_CARE_PROVIDER_SITE_OTHER): Payer: No Typology Code available for payment source | Admitting: Family Medicine

## 2021-06-25 ENCOUNTER — Other Ambulatory Visit: Payer: Self-pay

## 2021-06-25 VITALS — BP 144/88 | HR 79 | Temp 99.1°F | Resp 16 | Wt 258.8 lb

## 2021-06-25 DIAGNOSIS — I10 Essential (primary) hypertension: Secondary | ICD-10-CM

## 2021-06-25 NOTE — Progress Notes (Signed)
Established Patient Office Visit  Subjective:  Patient ID: Amy Schultz, female    DOB: 04-20-1956  Age: 65 y.o. MRN: [id number]  CC:  Chief Complaint  Patient presents with   Follow-up    HPI EZINNE YOGI presents for follow up of hypertension. She reports that she ate sodium laden food all weekend.   Past Medical History:  Diagnosis Date   Hypertension    Postmenopausal bleeding    Rash    both ankles x 3 weeks prescribed hydroxyazine prn and kenalog cream at urgent care 11-03-2019   Thyroid disease       Social History   Socioeconomic History   Marital status: Single    Spouse name: Not on file   Number of children: 3   Years of education: Not on file   Highest education level: Not on file  Occupational History   Not on file  Tobacco Use   Smoking status: Never   Smokeless tobacco: Never  Vaping Use   Vaping Use: Never used  Substance and Sexual Activity   Alcohol use: No   Drug use: No   Sexual activity: Not Currently    Birth control/protection: Surgical  Other Topics Concern   Not on file  Social History Narrative   Not on file   Social Determinants of Health   Financial Resource Strain: Not on file  Food Insecurity: Not on file  Transportation Needs: Not on file  Physical Activity: Not on file  Stress: Not on file  Social Connections: Not on file  Intimate Partner Violence: Not on file    ROS Review of Systems  All other systems reviewed and are negative.  Objective:   Today's Vitals: BP (!) 144/88   Pulse 79   Temp 99.1 F (37.3 C) (Oral)   Resp 16   Wt 258 lb 12.8 oz (117.4 kg)   SpO2 97%   BMI 36.10 kg/m   Physical Exam Vitals and nursing note reviewed.  Constitutional:      General: She is not in acute distress. Cardiovascular:     Rate and Rhythm: Normal rate and regular rhythm.  Pulmonary:     Effort: Pulmonary effort is normal.     Breath sounds: Normal breath sounds.  Abdominal:     Palpations: Abdomen is soft.      Tenderness: There is no abdominal tenderness.  Musculoskeletal:     Right lower leg: No edema.     Left lower leg: No edema.  Neurological:     General: No focal deficit present.     Mental Status: She is alert and oriented to person, place, and time.    Assessment & Plan:   1. Essential hypertension Improved but still slightly above goal. Patient deferred change in agent  for next 2 weeks when she will be more compliant to the recommended dietary choices.     Outpatient Encounter Medications as of 06/25/2021  Medication Sig   amLODipine (NORVASC) 10 MG tablet Take 1 tablet (10 mg total) by mouth daily.   aspirin 81 MG chewable tablet Chew by mouth daily.   Blood Pressure Monitoring (OMRON 3 SERIES BP MONITOR) DEVI USE AS DIRECTED   hydrochlorothiazide (HYDRODIURIL) 25 MG tablet Take 1 tablet (25 mg total) by mouth daily.   losartan (COZAAR) 100 MG tablet Take 1 tablet (100 mg total) by mouth daily.   triamcinolone cream (KENALOG) 0.1 % Apply 1 application topically 2 (two) times daily.   No facility-administered encounter  medications on file as of 06/25/2021.    Follow-up: Return in about 2 weeks (around 07/09/2021) for follow up.   Becky Sax, MD

## 2021-07-05 ENCOUNTER — Encounter: Payer: Self-pay | Admitting: Family Medicine

## 2021-07-05 ENCOUNTER — Ambulatory Visit (INDEPENDENT_AMBULATORY_CARE_PROVIDER_SITE_OTHER): Payer: No Typology Code available for payment source | Admitting: Family Medicine

## 2021-07-05 ENCOUNTER — Other Ambulatory Visit: Payer: Self-pay

## 2021-07-05 VITALS — BP 143/88 | HR 70 | Temp 98.1°F

## 2021-07-05 DIAGNOSIS — I10 Essential (primary) hypertension: Secondary | ICD-10-CM

## 2021-07-05 NOTE — Progress Notes (Signed)
  Established Patient Office Visit  Subjective:  Patient ID: Amy Schultz, female    DOB: 08-May-1956  Age: 65 y.o. MRN: [id number]  CC:  Chief Complaint  Patient presents with   Hypertension    HPI Amy Schultz presents for follow up of hypertension. Patient denies acute complaints or concerns.   Past Medical History:  Diagnosis Date   Hypertension    Postmenopausal bleeding    Rash    both ankles x 3 weeks prescribed hydroxyazine prn and kenalog cream at urgent care 11-03-2019   Thyroid disease       Social History   Socioeconomic History   Marital status: Single    Spouse name: Not on file   Number of children: 3   Years of education: Not on file   Highest education level: Not on file  Occupational History   Not on file  Tobacco Use   Smoking status: Never   Smokeless tobacco: Never  Vaping Use   Vaping Use: Never used  Substance and Sexual Activity   Alcohol use: No   Drug use: No   Sexual activity: Not Currently    Birth control/protection: Surgical  Other Topics Concern   Not on file  Social History Narrative   Not on file   Social Determinants of Health   Financial Resource Strain: Not on file  Food Insecurity: Not on file  Transportation Needs: Not on file  Physical Activity: Not on file  Stress: Not on file  Social Connections: Not on file  Intimate Partner Violence: Not on file    ROS Review of Systems  All other systems reviewed and are negative.  Objective:   Today's Vitals: BP (!) 143/88   Pulse 70   Temp 98.1 F (36.7 C) (Oral)   Physical Exam Vitals and nursing note reviewed.  Constitutional:      General: She is not in acute distress. Cardiovascular:     Rate and Rhythm: Normal rate and regular rhythm.  Pulmonary:     Effort: Pulmonary effort is normal.     Breath sounds: Normal breath sounds.  Abdominal:     Palpations: Abdomen is soft.     Tenderness: There is no abdominal tenderness.  Musculoskeletal:     Right lower  leg: No edema.     Left lower leg: No edema.  Neurological:     General: No focal deficit present.     Mental Status: She is alert and oriented to person, place, and time.    Assessment & Plan:  1. Essential hypertension Reading essentially unchanged and is slightly elevated. Discussed compliance. Patient is reluctant to change or add any agents at this time. Will continue present management and monitor.   Outpatient Encounter Medications as of 07/05/2021  Medication Sig   amLODipine (NORVASC) 10 MG tablet Take 1 tablet (10 mg total) by mouth daily.   aspirin 81 MG chewable tablet Chew by mouth daily.   Blood Pressure Monitoring (OMRON 3 SERIES BP MONITOR) DEVI USE AS DIRECTED   hydrochlorothiazide (HYDRODIURIL) 25 MG tablet Take 1 tablet (25 mg total) by mouth daily.   losartan (COZAAR) 100 MG tablet Take 1 tablet (100 mg total) by mouth daily.   triamcinolone cream (KENALOG) 0.1 % Apply 1 application topically 2 (two) times daily.   No facility-administered encounter medications on file as of 07/05/2021.    Follow-up: Return in about 3 months (around 10/05/2021) for follow up.   Becky Sax, MD

## 2021-07-05 NOTE — Progress Notes (Signed)
Patient is here for follow-up HTN

## 2021-08-29 ENCOUNTER — Other Ambulatory Visit: Payer: Self-pay

## 2021-08-29 ENCOUNTER — Encounter: Payer: Self-pay | Admitting: Family Medicine

## 2021-08-29 ENCOUNTER — Ambulatory Visit (INDEPENDENT_AMBULATORY_CARE_PROVIDER_SITE_OTHER): Payer: No Typology Code available for payment source | Admitting: Family Medicine

## 2021-08-29 VITALS — BP 145/85 | HR 73 | Resp 16

## 2021-08-29 DIAGNOSIS — M25561 Pain in right knee: Secondary | ICD-10-CM | POA: Diagnosis not present

## 2021-08-29 DIAGNOSIS — I10 Essential (primary) hypertension: Secondary | ICD-10-CM | POA: Diagnosis not present

## 2021-08-29 MED ORDER — TRIAMCINOLONE ACETONIDE 40 MG/ML IJ SUSP
40.0000 mg | Freq: Once | INTRAMUSCULAR | Status: AC
Start: 2021-08-29 — End: 2021-08-29
  Administered 2021-08-29: 40 mg via INTRAMUSCULAR

## 2021-08-29 NOTE — Progress Notes (Signed)
Patient is here for knee pain that has been present x a few weeks 3/10. No other concerns today

## 2021-08-30 ENCOUNTER — Encounter: Payer: Self-pay | Admitting: Family Medicine

## 2021-08-30 NOTE — Progress Notes (Signed)
Established Patient Office Visit  Subjective:  Patient ID: Amy Schultz, female    DOB: 1955-12-11  Age: 66 y.o. MRN: [id number]  CC:  Chief Complaint  Patient presents with   Knee Pain    HPI Amy Schultz presents for follow up of hypertension as well as complaint of increasing right knee pain. Patient denies known trauma or injury.   Past Medical History:  Diagnosis Date   Hypertension    Postmenopausal bleeding    Rash    both ankles x 3 weeks prescribed hydroxyazine prn and kenalog cream at urgent care 11-03-2019   Thyroid disease     Past Surgical History:  Procedure Laterality Date   COLONOSCOPY     DILATATION & CURETTAGE/HYSTEROSCOPY WITH MYOSURE N/A 11/10/2019   Procedure: [place];  Surgeon: Servando Salina, MD;  Location: [facility];  Service: Gynecology;  Laterality: N/A;   THYROID SURGERY     didn't remove thyroid   TUBAL LIGATION      Family History  Problem Relation Age of Onset   Diabetes Mother    Hypertension Mother    Hypertension Sister    Breast cancer Sister        unsure age of onset   Breast cancer Maternal Aunt        unsure age of onset   Diabetes Maternal Grandmother    Colon cancer Neg Hx    Esophageal cancer Neg Hx    Rectal cancer Neg Hx    Stomach cancer Neg Hx    Colon polyps Neg Hx     Social History   Socioeconomic History   Marital status: Single    Spouse name: Not on file   Number of children: 3   Years of education: Not on file   Highest education level: Not on file  Occupational History   Not on file  Tobacco Use   Smoking status: Never   Smokeless tobacco: Never  Vaping Use   Vaping Use: Never used  Substance and Sexual Activity   Alcohol use: No   Drug use: No   Sexual activity: Not Currently    Birth control/protection: Surgical  Other Topics Concern   Not on file  Social History Narrative   Not on file   Social Determinants of Health    Financial Resource Strain: Not on file  Food Insecurity: Not on file  Transportation Needs: Not on file  Physical Activity: Not on file  Stress: Not on file  Social Connections: Not on file  Intimate Partner Violence: Not on file    ROS Review of Systems  All other systems reviewed and are negative.  Objective:   Today's Vitals: BP (!) 145/85    Pulse 73    Resp 16    SpO2 98%   Physical Exam Vitals and nursing note reviewed.  Constitutional:      General: She is not in acute distress. Cardiovascular:     Rate and Rhythm: Normal rate and regular rhythm.  Pulmonary:     Effort: Pulmonary effort is normal.     Breath sounds: Normal breath sounds.  Abdominal:     Palpations: Abdomen is soft.     Tenderness: There is no abdominal tenderness.  Musculoskeletal:     Right knee: No swelling or deformity. Decreased range of motion. Tenderness present.  Neurological:     General: No focal deficit present.     Mental Status: She is alert and oriented  to person, place, and time.    Assessment & Plan:   1. Essential hypertension Slightly elevated readings ? 2/2 pain. Continue present management and monitor  2. Right knee pain, unspecified chronicity Kenalog IM injection given. Tylenol/nsaids prn. [facility] scheduled appt with consultant. - triamcinolone acetonide (KENALOG-40) injection 40 mg    Outpatient Encounter Medications as of 08/29/2021  Medication Sig   amLODipine (NORVASC) 10 MG tablet Take 1 tablet (10 mg total) by mouth daily.   aspirin 81 MG chewable tablet Chew by mouth daily.   Blood Pressure Monitoring (OMRON 3 SERIES BP MONITOR) DEVI USE AS DIRECTED   hydrochlorothiazide (HYDRODIURIL) 25 MG tablet Take 1 tablet (25 mg total) by mouth daily.   losartan (COZAAR) 100 MG tablet Take 1 tablet (100 mg total) by mouth daily.   triamcinolone cream (KENALOG) 0.1 % Apply 1 application topically 2 (two) times daily.   [EXPIRED] triamcinolone acetonide (KENALOG-40)  injection 40 mg    No facility-administered encounter medications on file as of 08/29/2021.    Follow-up: No follow-ups on file.   Becky Sax, MD

## 2021-09-06 ENCOUNTER — Ambulatory Visit: Payer: No Typology Code available for payment source | Admitting: Family Medicine

## 2021-09-11 ENCOUNTER — Other Ambulatory Visit (HOSPITAL_COMMUNITY): Payer: Self-pay

## 2021-09-11 ENCOUNTER — Other Ambulatory Visit: Payer: Self-pay | Admitting: Family Medicine

## 2021-09-11 MED ORDER — AMLODIPINE BESYLATE 10 MG PO TABS
10.0000 mg | ORAL_TABLET | Freq: Every day | ORAL | 0 refills | Status: DC
  Filled 2021-09-11: qty 90, 90d supply, fill #0

## 2021-09-11 MED ORDER — LOSARTAN POTASSIUM 100 MG PO TABS
100.0000 mg | ORAL_TABLET | Freq: Every day | ORAL | 0 refills | Status: DC
  Filled 2021-09-11: qty 90, 90d supply, fill #0

## 2021-09-11 MED ORDER — HYDROCHLOROTHIAZIDE 25 MG PO TABS
25.0000 mg | ORAL_TABLET | Freq: Every day | ORAL | 0 refills | Status: DC
  Filled 2021-09-11: qty 90, 90d supply, fill #0

## 2021-09-20 ENCOUNTER — Other Ambulatory Visit (HOSPITAL_COMMUNITY): Payer: Self-pay

## 2021-09-20 MED ORDER — CARESTART COVID-19 HOME TEST VI KIT
PACK | 0 refills | Status: DC
  Filled 2021-09-20: qty 4, 4d supply, fill #0

## 2021-09-27 ENCOUNTER — Other Ambulatory Visit: Payer: Self-pay

## 2021-09-27 ENCOUNTER — Encounter: Payer: Self-pay | Admitting: Podiatrist

## 2021-09-27 ENCOUNTER — Ambulatory Visit (INDEPENDENT_AMBULATORY_CARE_PROVIDER_SITE_OTHER): Payer: No Typology Code available for payment source | Admitting: Podiatrist

## 2021-09-27 ENCOUNTER — Ambulatory Visit (INDEPENDENT_AMBULATORY_CARE_PROVIDER_SITE_OTHER): Payer: No Typology Code available for payment source

## 2021-09-27 DIAGNOSIS — Z8639 Personal history of other endocrine, nutritional and metabolic disease: Secondary | ICD-10-CM | POA: Insufficient documentation

## 2021-09-27 DIAGNOSIS — M2042 Other hammer toe(s) (acquired), left foot: Secondary | ICD-10-CM

## 2021-09-27 DIAGNOSIS — R109 Unspecified abdominal pain: Secondary | ICD-10-CM | POA: Insufficient documentation

## 2021-09-27 DIAGNOSIS — K5909 Other constipation: Secondary | ICD-10-CM | POA: Insufficient documentation

## 2021-09-27 DIAGNOSIS — M25561 Pain in right knee: Secondary | ICD-10-CM | POA: Insufficient documentation

## 2021-09-27 NOTE — Patient Instructions (Signed)
Hammer Toe Hammer toe is a change in the shape, or a deformity, of the toe. The deformity causes the middle joint of the toe to stay bent. Hammer toe starts gradually. At first, the toe can be straightened. Then over time, the toe deformity becomes stiff, inflexible, and permanently bent. Hammer toe usually affects the second, third, or fourth toe. A hammer toe causes pain, especially when wearing shoes. Corns and calluses can result from the toe rubbing against the inside of the shoe. Early treatments to keep the toe straight may relieve pain. As the deformity of the toe becomes stiff and permanent, surgery may be needed to straighten the toe. What are the causes? This condition is caused by abnormal bending of the toe joint that is closest to your foot. Over time, the toe bending downward pulls on the muscles and connections (tendons) of the toe joint, making them weak and stiff. Wearing shoes that are too narrow in the toe box and do not allow toes to fully straighten can cause this condition. What increases the risk? You are more likely to develop this condition if you: Are an older female. Wear shoes that are too small, or wear high-heeled shoes that pinch your toes. Have a second toe that is longer than your big toe (first toe). Injure your foot or toe. Have arthritis, or have a nerve or muscle disorder. Have diabetes or a condition known as Charcot joint, which may cause you to walk abnormally. Have a family history of hammer toe. Are a ballet dancer. What are the signs or symptoms? Pain and deformity of the toe are the main symptoms of this condition. The pain is worse when wearing shoes, walking, or running. Other symptoms may include: A thickened patch of skin, called a corn or callus, that forms over the top of the bent part of the toe or between the toes. Redness and a burning feeling on the bent toe. An open sore that forms on the top of the bent toe. Not being able to straighten  the affected toe. How is this diagnosed? This condition is diagnosed based on your symptoms and a physical exam. During the exam, your health care provider will try to straighten your toe to see how stiff the deformity is. You may also have tests, such as: A blood test to check for rheumatoid arthritis or diabetes. An X-ray to show how severe the toe deformity is. How is this treated? Treatment for this condition depends on whether the toe is flexible or deformed and no longer moveable. In less severe cases, a hammer toe can be straightened without surgery. These treatments include: Taping the toe into a straightened position. Using pads and cushions to protect the bent toe. Wearing shoes that provide enough room for the toes. Doing toe-stretching exercises at home. Taking an NSAID, such as ibuprofen, to reduce pain and swelling. Using special orthotics or insoles for pain relief and to improve walking. If these treatments do not help or the toe has a severe deformity and cannot be straightened, surgery is the next option. The most common surgeries used to straighten a hammer toe include: Arthroplasty or osteotomy. Part of the toe joint is reconstructed or removed, which allows the toe to straighten. Fusion. Cartilage between the two bones of the joint is taken out, and the bones are fused together into one longer bone. Implantation. Part of the bone is removed and replaced with an implant to allow the toe to move again. Flexor tendon transfer.   The tendons that curl the toes down (flexor tendons) are repositioned. Follow these instructions at home: Take over-the-counter and prescription medicines only as told by your health care provider. Do toe-straightening and stretching exercises as told by your health care provider. Keep all follow-up visits. This is important. How is this prevented? Wear shoes that fit properly and give your toes enough room. Shoes should not cause pain. Buy shoes at  the end of the day to make sure they fit well, since your foot may swell during the day. Make sure they are comfortable before you buy them. As you age, your shoe size might change, including the width. Measure both feet and buy shoes for the larger foot. A shoe repair store might be able to stretch shoes that feel tight in spots. Do not wear high-heeled shoes or shoes with pointed toes. Contact a health care provider if: Your pain gets worse. Your toe becomes red or swollen. You develop an open sore on your toe. Summary Hammer toe is a condition that gradually causes your toe to become bent and stiff. Hammer toe can be treated by taping the toe into a straightened position and doing toe-stretching exercises. If these treatments do not help, surgery may be needed. To prevent this condition, wear shoes that fit properly, give your toes enough room, and do not cause pain. This information is not intended to replace advice given to you by your health care provider. Make sure you discuss any questions you have with your health care provider. Document Revised: 11/18/2019 Document Reviewed: 11/18/2019 Elsevier Patient Education  2022 Elsevier Inc.  

## 2021-09-27 NOTE — Progress Notes (Signed)
Chief Complaint  Patient presents with   Toe Pain    5th toe left - corn x years, uses medicated corn remover-very tender and macerated today, rubbing shoes   New Patient (Initial Visit)     HPI: Patient is 66 y.o. female who presents today for pain on the lateral aspect of the left fifth toe.  She relates putting corn remover on the area and relates the area is very tender and sore now.  She has had the corn for years and she is unable to get it to go away.     Patient Active Problem List   Diagnosis Date Noted   Abdominal pain 09/27/2021   Chronic constipation 09/27/2021   History of thyroid nodule 09/27/2021   Right knee pain 09/27/2021   Dizziness and giddiness 09/19/2020   Subclinical hypothyroidism 02/14/2020   Elevated TSH 10/01/2019   Prediabetes 09/30/2019   Candidate for statin therapy due to risk of future cardiovascular event 09/30/2019   Essential hypertension 11/07/2011    Current Outpatient Medications on File Prior to Visit  Medication Sig Dispense Refill   amLODipine (NORVASC) 10 MG tablet Take 1 tablet (10 mg total) by mouth daily. 90 tablet 0   aspirin 81 MG chewable tablet Chew by mouth daily.     Blood Pressure Monitoring (OMRON 3 SERIES BP MONITOR) DEVI USE AS DIRECTED 1 each 0   COVID-19 At Home Antigen Test (CARESTART COVID-19 HOME TEST) KIT Use as directed 4 each 0   hydrochlorothiazide (HYDRODIURIL) 25 MG tablet Take 1 tablet (25 mg total) by mouth daily. 90 tablet 0   losartan (COZAAR) 100 MG tablet Take 1 tablet (100 mg total) by mouth daily. 90 tablet 0   triamcinolone cream (KENALOG) 0.1 % Apply 1 application topically 2 (two) times daily. 45 g 0   No current facility-administered medications on file prior to visit.    Allergies  Allergen Reactions   Codeine Nausea Only   Darvocet [Propoxyphene N-Acetaminophen] Nausea Only   Demerol Nausea Only   Hydrocodone Nausea Only   Penicillins Nausea Only    Review of Systems No fevers, chills,  nausea, muscle aches, no difficulty breathing, no calf pain, no chest pain or shortness of breath.   Physical Exam  GENERAL APPEARANCE: Alert, conversant. Appropriately groomed. No acute distress.   VASCULAR: Pedal pulses palpable DP and PT bilateral.  Capillary refill time is immediate to all digits,  Proximal to distal cooling it warm to warm.  Digital perfusion adequate.   NEUROLOGIC: sensation is intact to 5.07 monofilament at 5/5 sites bilateral.  Light touch is intact bilateral, vibratory sensation intact bilateral  MUSCULOSKELETAL: acceptable muscle strength, tone and stability bilateral.  Contracture of the fifth digit of the left foot is noted.    Pain at the head of the proximal phalanx is noted.     DERMATOLOGIC: skin is warm, supple, and dry.  Macerated tissue overlying the corn is noted.  No redness, no swelling, no sign of infection is noted.    Xray:  3 views of the left foot are obtained.  Adductovarus deformity of the left fifth digit is noted with hammertoe deformity  noted.      Assessment     ICD-10-CM   1. Hammer toe of left foot  M20.42 DG Foot Complete Left       Plan  Discussed exam and xray findings with the patient.  Discussed the corn is from the underlying bone formation and will not be corrected with  corn removers.  I recommended not using the chemical corn remover and instead using offloading pads to keep pressure off the toe. I dispensed different offloading pads for her to try.  I discussed if the pain does not go away, she would likely need surgery to correct the toe.  She will consider her options and call if she would like to discuss surgery at a latera date.

## 2021-10-05 ENCOUNTER — Ambulatory Visit: Payer: No Typology Code available for payment source | Admitting: Family Medicine

## 2021-11-20 ENCOUNTER — Ambulatory Visit (INDEPENDENT_AMBULATORY_CARE_PROVIDER_SITE_OTHER): Payer: No Typology Code available for payment source

## 2021-11-20 ENCOUNTER — Encounter (HOSPITAL_COMMUNITY): Payer: Self-pay

## 2021-11-20 ENCOUNTER — Ambulatory Visit (HOSPITAL_COMMUNITY)
Admission: EM | Admit: 2021-11-20 | Discharge: 2021-11-20 | Disposition: A | Discharge status: Home / Self Care | Payer: No Typology Code available for payment source | Attending: Family Medicine | Admitting: Family Medicine

## 2021-11-20 DIAGNOSIS — R1030 Lower abdominal pain, unspecified: Secondary | ICD-10-CM

## 2021-11-20 DIAGNOSIS — K5900 Constipation, unspecified: Secondary | ICD-10-CM

## 2021-11-20 NOTE — Discharge Instructions (Addendum)
Your x-ray did show some stool in the abdomen ? ?Take MiraLAX--1 capful daily to help the constipation.  You can take this chronically if you need to.  You may take a few days for it to completely relieve the constipation. ? ?It also may be a good idea to do a Dulcolax/bisacodyl suppository once to get things started ? ?Please see your primary care physician in the next 2 to 3 weeks if constipation or pain are not improving ?

## 2021-11-20 NOTE — ED Triage Notes (Signed)
Pt c/o lower abdominal pain/cramping x1 month off and on. States gets better after taking a laxative. Last NBM was Saturday.  ?

## 2021-11-20 NOTE — ED Provider Notes (Signed)
?[facility] ? ? ? ?CSN: [id number] ?Arrival date & time: 11/20/21  1017 ? ? ?  ? ?History   ?Chief Complaint ?Chief Complaint  ?Patient presents with  ? Abdominal Pain  ? ? ?HPI ?Amy Schultz is a 66 y.o. female.  ? ? ?Abdominal Pain ?Here for about a 1 month history of lower abdominal pain that has been intermittent.  It can improve if she uses a laxative.  She has been more constipated than usual in the period of time also.  Stools have been hard.  No blood in the stool or in the urine.  No dysuria. ? ?Last colonoscopy was 2019 and was clear by the patient's report ? ?She is on amlodipine, hydrochlorothiazide, and losartan for hypertension ? ? ?Past Medical History:  ?Diagnosis Date  ? Hypertension   ? Postmenopausal bleeding   ? Rash   ? both ankles x 3 weeks prescribed hydroxyazine prn and kenalog cream at urgent care 11-03-2019  ? Thyroid disease   ? ? ?Patient Active Problem List  ? Diagnosis Date Noted  ? Abdominal pain 09/27/2021  ? Chronic constipation 09/27/2021  ? History of thyroid nodule 09/27/2021  ? Right knee pain 09/27/2021  ? Dizziness and giddiness 09/19/2020  ? Subclinical hypothyroidism 02/14/2020  ? Elevated TSH 10/01/2019  ? Prediabetes 09/30/2019  ? Candidate for statin therapy due to risk of future cardiovascular event 09/30/2019  ? Essential hypertension 11/07/2011  ? ? ?Past Surgical History:  ?Procedure Laterality Date  ? COLONOSCOPY    ? DILATATION & CURETTAGE/HYSTEROSCOPY WITH MYOSURE N/A 11/10/2019  ? Procedure: [place];  Surgeon: Servando Salina, MD;  Location: [facility];  Service: Gynecology;  Laterality: N/A;  ? THYROID SURGERY    ? didn't remove thyroid  ? TUBAL LIGATION    ? ? ?OB History   ? ? Gravida  ?4  ? Para  ?3  ? Term  ?   ? Preterm  ?   ? AB  ?1  ? Living  ?   ?  ? ? SAB  ?1  ? IAB  ?   ? Ectopic  ?   ? Multiple  ?   ? Live Births  ?   ?   ?  ?  ? ? ? ?Home Medications   ? ?Prior to Admission  medications   ?Medication Sig Start Date End Date Taking? Authorizing Provider  ?amLODipine (NORVASC) 10 MG tablet Take 1 tablet (10 mg total) by mouth daily. 09/11/21   Dorna Mai, MD  ?aspirin 81 MG chewable tablet Chew by mouth daily.    [provider]  ?COVID-19 At Home Antigen Test [facility] COVID-19 HOME TEST) KIT Use as directed 09/20/21   Jefm Bryant, [place]  ?hydrochlorothiazide (HYDRODIURIL) 25 MG tablet Take 1 tablet (25 mg total) by mouth daily. 09/11/21   Dorna Mai, MD  ?losartan (COZAAR) 100 MG tablet Take 1 tablet (100 mg total) by mouth daily. 09/11/21   Dorna Mai, MD  ?triamcinolone cream (KENALOG) 0.1 % Apply 1 application topically 2 (two) times daily. 06/05/21   Dorna Mai, MD  ? ? ?Family History ?Family History  ?Problem Relation Age of Onset  ? Diabetes Mother   ? Hypertension Mother   ? Hypertension Sister   ? Breast cancer Sister   ?     unsure age of onset  ? Breast cancer Maternal Aunt   ?     unsure age of onset  ?  Diabetes Maternal Grandmother   ? Colon cancer Neg Hx   ? Esophageal cancer Neg Hx   ? Rectal cancer Neg Hx   ? Stomach cancer Neg Hx   ? Colon polyps Neg Hx   ? ? ?Social History ?Social History  ? ?Tobacco Use  ? Smoking status: Never  ? Smokeless tobacco: Never  ?Vaping Use  ? Vaping Use: Never used  ?Substance Use Topics  ? Alcohol use: No  ? Drug use: No  ? ? ? ?Allergies   ?Codeine, Darvocet [propoxyphene n-acetaminophen], Demerol, Hydrocodone, and Penicillins ? ? ?Review of Systems ?Review of Systems  ?Gastrointestinal:  Positive for abdominal pain.  ? ? ?Physical Exam ?Triage Vital Signs ?ED Triage Vitals [11/20/21 0824]  ?Enc Vitals Group  ?   BP (!) 150/96  ?   Pulse Rate 70  ?   Resp 18  ?   Temp 98.4 ?F (36.9 ?C)  ?   Temp Source Oral  ?   SpO2 97 %  ?   Weight   ?   Height   ?   Head Circumference   ?   Peak Flow   ?   Pain Score 6  ?   Pain Loc   ?   Pain Edu?   ?   Excl. in [place]?   ? ?No data found. ? ?Updated Vital Signs ?BP (!) 150/96  (BP Location: Left Arm)   Pulse 70   Temp 98.4 ?F (36.9 ?C) (Oral)   Resp 18   SpO2 97%  ? ?Visual Acuity ?Right Eye Distance:   ?Left Eye Distance:   ?Bilateral Distance:   ? ?Right Eye Near:   ?Left Eye Near:    ?Bilateral Near:    ? ?Physical Exam ?Constitutional:   ?   General: She is not in acute distress. ?   Appearance: She is not ill-appearing, toxic-appearing or diaphoretic.  ?HENT:  ?   Mouth/Throat:  ?   Mouth: Mucous membranes are moist.  ?Cardiovascular:  ?   Rate and Rhythm: Normal rate and regular rhythm.  ?   Heart sounds: No murmur heard. ?Pulmonary:  ?   Effort: Pulmonary effort is normal.  ?   Breath sounds: Normal breath sounds.  ?Abdominal:  ?   General: There is no distension.  ?   Palpations: Abdomen is soft. There is no mass.  ?   Tenderness: There is no abdominal tenderness. There is no guarding.  ?Skin: ?   Capillary Refill: Capillary refill takes less than 2 seconds.  ?   Coloration: Skin is not pale.  ?Neurological:  ?   Mental Status: She is alert and oriented to person, place, and time.  ?Psychiatric:     ?   Behavior: Behavior normal.  ? ? ? ?UC Treatments / Results  ?Labs ?(all labs ordered are listed, but only abnormal results are displayed) ?Labs Reviewed - No data to display ? ?EKG ? ? ?Radiology ?DG Abd 1 View ? ?Result Date: 11/20/2021 ?CLINICAL DATA:  Lower abdominal pain. EXAM: ABDOMEN - 1 VIEW COMPARISON:  None. FINDINGS: The bowel gas pattern is normal. Phleboliths are noted in the pelvis. Mild amount of stool seen throughout the colon and rectum. IMPRESSION: Mild stool burden.  No abnormal bowel dilatation. Electronically Signed   By: Marijo Conception M.D.   On: 11/20/2021 08:47   ? ?Procedures ?Procedures (including critical care time) ? ?Medications Ordered in UC ?Medications - No data to display ? ?Initial Impression /  Assessment and Plan / UC Course  ?I have reviewed the triage vital signs and the nursing notes. ? ?Pertinent labs & imaging results that were available  during my care of the patient were reviewed by me and considered in my medical decision making (see chart for details). ? ?  ? ?Xray shows moderate amount of stool in the colon. I am going to have her start miralax daily, and she will follow up with PCP if bowel habit changes or pain continues. ?Final Clinical Impressions(s) / UC Diagnoses  ? ?Final diagnoses:  ?Lower abdominal pain  ?Constipation, unspecified constipation type  ? ? ? ?Discharge Instructions   ? ?  ?Your x-ray did show some stool in the abdomen ? ?Take MiraLAX--1 capful daily to help the constipation.  You can take this chronically if you need to.  You may take a few days for it to completely relieve the constipation. ? ?It also may be a good idea to do a Dulcolax/bisacodyl suppository once to get things started ? ?Please see your primary care physician in the next 2 to 3 weeks if constipation or pain are not improving ? ? ? ? ?ED Prescriptions   ?None ?  ? ?PDMP not reviewed this encounter. ?  ?Barrett Henle, MD ?11/20/21 [phone] ? ?

## 2021-11-22 ENCOUNTER — Telehealth: Payer: Self-pay | Admitting: Family Medicine

## 2021-11-22 NOTE — Telephone Encounter (Signed)
Copied from [facility] [id number]. Topic: General - Inquiry ?>> Nov 19, 2021  2:18 PM Greggory Keen D wrote: ?Pt called asking for  an order for a c pap machine.   ?CB#  [phone] ?>> Nov 22, 2021 12:18 PM Leward Quan A wrote: ?Patient called in to inform Dr Redmond Pulling that she would like to have a sleep study test done to decide whether she will need a C-Pap machine because of her excessive snoring. Please advise and call patient  at Ph# [phone] ?

## 2021-12-24 ENCOUNTER — Other Ambulatory Visit: Payer: Self-pay | Admitting: Family Medicine

## 2021-12-24 ENCOUNTER — Other Ambulatory Visit (HOSPITAL_COMMUNITY): Payer: Self-pay

## 2021-12-24 MED ORDER — AMLODIPINE BESYLATE 10 MG PO TABS
10.0000 mg | ORAL_TABLET | Freq: Every day | ORAL | 0 refills | Status: DC
  Filled 2021-12-24: qty 90, 90d supply, fill #0

## 2021-12-24 MED ORDER — HYDROCHLOROTHIAZIDE 25 MG PO TABS
25.0000 mg | ORAL_TABLET | Freq: Every day | ORAL | 0 refills | Status: DC
  Filled 2021-12-24: qty 90, 90d supply, fill #0

## 2021-12-24 MED ORDER — LOSARTAN POTASSIUM 100 MG PO TABS
100.0000 mg | ORAL_TABLET | Freq: Every day | ORAL | 0 refills | Status: DC
  Filled 2021-12-24: qty 90, 90d supply, fill #0

## 2022-01-04 ENCOUNTER — Ambulatory Visit (INDEPENDENT_AMBULATORY_CARE_PROVIDER_SITE_OTHER): Payer: No Typology Code available for payment source | Admitting: Family Medicine

## 2022-01-04 ENCOUNTER — Encounter: Payer: Self-pay | Admitting: Family Medicine

## 2022-01-04 ENCOUNTER — Other Ambulatory Visit (HOSPITAL_COMMUNITY): Payer: Self-pay

## 2022-01-04 VITALS — BP 133/83 | HR 59 | Temp 98.4°F | Resp 16

## 2022-01-04 DIAGNOSIS — E6609 Other obesity due to excess calories: Secondary | ICD-10-CM

## 2022-01-04 DIAGNOSIS — I10 Essential (primary) hypertension: Secondary | ICD-10-CM

## 2022-01-04 DIAGNOSIS — R0683 Snoring: Secondary | ICD-10-CM

## 2022-01-04 MED ORDER — PHENTERMINE HCL 37.5 MG PO TABS
37.5000 mg | ORAL_TABLET | Freq: Every day | ORAL | 0 refills | Status: DC
  Filled 2022-01-04: qty 30, 30d supply, fill #0

## 2022-01-04 NOTE — Progress Notes (Signed)
? ?Established Patient Office Visit ? ?Subjective   ? ?Patient ID: Amy Schultz, female    DOB: Jun 06, 1956  Age: 66 y.o. MRN: [id number] ? ?CC:  ?Chief Complaint  ?Patient presents with  ? request sleep study  ? ? ?HPI ?Amy Schultz presents for complaint of snoring and desires sleep study. She also would like to lose weight.  ? ? ?Outpatient Encounter Medications as of 01/04/2022  ?Medication Sig  ? amLODipine (NORVASC) 10 MG tablet Take 1 tablet (10 mg total) by mouth daily.  ? aspirin 81 MG chewable tablet Chew by mouth daily.  ? COVID-19 At Home Antigen Test [facility] Dt COVID-19 HOME TEST) KIT Use as directed  ? hydrochlorothiazide (HYDRODIURIL) 25 MG tablet Take 1 tablet (25 mg total) by mouth daily.  ? losartan (COZAAR) 100 MG tablet Take 1 tablet (100 mg total) by mouth daily.  ? phentermine (ADIPEX-P) 37.5 MG tablet Take 1 tablet (37.5 mg total) by mouth daily before breakfast.  ? triamcinolone cream (KENALOG) 0.1 % Apply 1 application topically 2 (two) times daily.  ? ?No facility-administered encounter medications on file as of 01/04/2022.  ? ? ?Past Medical History:  ?Diagnosis Date  ? Hypertension   ? Postmenopausal bleeding   ? Rash   ? both ankles x 3 weeks prescribed hydroxyazine prn and kenalog cream at urgent care 11-03-2019  ? Thyroid disease   ? ? ?Past Surgical History:  ?Procedure Laterality Date  ? COLONOSCOPY    ? DILATATION & CURETTAGE/HYSTEROSCOPY WITH MYOSURE N/A 11/10/2019  ? Procedure: [place];  Surgeon: Servando Salina, MD;  Location: [facility];  Service: Gynecology;  Laterality: N/A;  ? THYROID SURGERY    ? didn't remove thyroid  ? TUBAL LIGATION    ? ? ?Family History  ?Problem Relation Age of Onset  ? Diabetes Mother   ? Hypertension Mother   ? Hypertension Sister   ? Breast cancer Sister   ?     unsure age of onset  ? Breast cancer Maternal Aunt   ?     unsure age of onset  ? Diabetes Maternal Grandmother   ? Colon cancer Neg  Hx   ? Esophageal cancer Neg Hx   ? Rectal cancer Neg Hx   ? Stomach cancer Neg Hx   ? Colon polyps Neg Hx   ? ? ?Social History  ? ?Socioeconomic History  ? Marital status: Single  ?  Spouse name: Not on file  ? Number of children: 3  ? Years of education: Not on file  ? Highest education level: Not on file  ?Occupational History  ? Not on file  ?Tobacco Use  ? Smoking status: Never  ? Smokeless tobacco: Never  ?Vaping Use  ? Vaping Use: Never used  ?Substance and Sexual Activity  ? Alcohol use: No  ? Drug use: No  ? Sexual activity: Not Currently  ?  Birth control/protection: Surgical  ?Other Topics Concern  ? Not on file  ?Social History Narrative  ? Not on file  ? ?Social Determinants of Health  ? ?Financial Resource Strain: Not on file  ?Food Insecurity: Not on file  ?Transportation Needs: Not on file  ?Physical Activity: Not on file  ?Stress: Not on file  ?Social Connections: Not on file  ?Intimate Partner Violence: Not on file  ? ? ?Review of Systems  ?All other systems reviewed and are negative. ? ?  ? ? ?Objective   ? ?BP 133/83  Pulse (!) 59   Temp 98.4 ?F (36.9 ?C) (Oral)   Resp 16   SpO2 96%  ? ?Physical Exam ?Vitals and nursing note reviewed.  ?Constitutional:   ?   General: She is not in acute distress. ?Cardiovascular:  ?   Rate and Rhythm: Normal rate and regular rhythm.  ?Pulmonary:  ?   Effort: Pulmonary effort is normal.  ?   Breath sounds: Normal breath sounds.  ?Abdominal:  ?   Palpations: Abdomen is soft.  ?   Tenderness: There is no abdominal tenderness.  ?Neurological:  ?   General: No focal deficit present.  ?   Mental Status: She is alert and oriented to person, place, and time.  ? ? ? ?  ? ?Assessment & Plan:  ? ?1. Snoring ?Referral for sleep study.  ?- Home sleep test ? ?2. Obesity due to excess calories with serious comorbidity, unspecified classification ?Phentermine prescribed. Goal is 3-5lbs st loss/ month ? ?3. Essential hypertension ?Appears stable with present management.  Continue and monitor ? ? ? ?Return for physical.  ? ?Becky Sax, MD ? ? ?

## 2022-02-22 ENCOUNTER — Ambulatory Visit: Payer: No Typology Code available for payment source | Admitting: Family Medicine

## 2022-03-22 ENCOUNTER — Other Ambulatory Visit: Payer: Self-pay | Admitting: Family Medicine

## 2022-03-22 DIAGNOSIS — Z1231 Encounter for screening mammogram for malignant neoplasm of breast: Secondary | ICD-10-CM

## 2022-04-02 ENCOUNTER — Ambulatory Visit (INDEPENDENT_AMBULATORY_CARE_PROVIDER_SITE_OTHER): Payer: No Typology Code available for payment source | Admitting: Family Medicine

## 2022-04-02 ENCOUNTER — Other Ambulatory Visit: Payer: Self-pay | Admitting: Family Medicine

## 2022-04-02 ENCOUNTER — Encounter: Payer: Self-pay | Admitting: Family Medicine

## 2022-04-02 VITALS — BP 125/85 | HR 75 | Temp 98.1°F | Resp 16 | Ht 71.0 in | Wt 265.6 lb

## 2022-04-02 DIAGNOSIS — Z13220 Encounter for screening for lipoid disorders: Secondary | ICD-10-CM

## 2022-04-02 DIAGNOSIS — Z0000 Encounter for general adult medical examination without abnormal findings: Secondary | ICD-10-CM

## 2022-04-02 DIAGNOSIS — R0683 Snoring: Secondary | ICD-10-CM | POA: Diagnosis not present

## 2022-04-02 DIAGNOSIS — Z0001 Encounter for general adult medical examination with abnormal findings: Secondary | ICD-10-CM | POA: Diagnosis not present

## 2022-04-02 DIAGNOSIS — Z130 Encounter for screening for diseases of the blood and blood-forming organs and certain disorders involving the immune mechanism: Secondary | ICD-10-CM

## 2022-04-02 NOTE — Progress Notes (Unsigned)
Established Patient Office Visit  Subjective    Patient ID: Amy Schultz, female    DOB: 1955/11/17  Age: 66 y.o. MRN: [id number]  CC:  Chief Complaint  Patient presents with   Annual Exam    HPI Amy Schultz presents for routine annual exam. Patient denies acute complaints or concerns.    Outpatient Encounter Medications as of 04/02/2022  Medication Sig   amLODipine (NORVASC) 10 MG tablet Take 1 tablet (10 mg total) by mouth daily.   aspirin 81 MG chewable tablet Chew by mouth daily.   COVID-19 At Home Antigen Test [facility] COVID-19 HOME TEST) KIT Use as directed   hydrochlorothiazide (HYDRODIURIL) 25 MG tablet Take 1 tablet (25 mg total) by mouth daily.   losartan (COZAAR) 100 MG tablet Take 1 tablet (100 mg total) by mouth daily.   phentermine (ADIPEX-P) 37.5 MG tablet Take 1 tablet (37.5 mg total) by mouth daily before breakfast.   triamcinolone cream (KENALOG) 0.1 % Apply 1 application topically 2 (two) times daily.   No facility-administered encounter medications on file as of 04/02/2022.    Past Medical History:  Diagnosis Date   Hypertension    Postmenopausal bleeding    Rash    both ankles x 3 weeks prescribed hydroxyazine prn and kenalog cream at urgent care 11-03-2019   Thyroid disease     Past Surgical History:  Procedure Laterality Date   COLONOSCOPY     DILATATION & CURETTAGE/HYSTEROSCOPY WITH MYOSURE N/A 11/10/2019   Procedure: [place];  Surgeon: Servando Salina, MD;  Location: [facility];  Service: Gynecology;  Laterality: N/A;   THYROID SURGERY     didn't remove thyroid   TUBAL LIGATION      Family History  Problem Relation Age of Onset   Diabetes Mother    Hypertension Mother    Hypertension Sister    Breast cancer Sister        unsure age of onset   Breast cancer Maternal Aunt        unsure age of onset   Diabetes Maternal Grandmother    Colon cancer Neg Hx    Esophageal cancer  Neg Hx    Rectal cancer Neg Hx    Stomach cancer Neg Hx    Colon polyps Neg Hx     Social History   Socioeconomic History   Marital status: Single    Spouse name: Not on file   Number of children: 3   Years of education: Not on file   Highest education level: Not on file  Occupational History   Not on file  Tobacco Use   Smoking status: Never   Smokeless tobacco: Never  Vaping Use   Vaping Use: Never used  Substance and Sexual Activity   Alcohol use: No   Drug use: No   Sexual activity: Not Currently    Birth control/protection: Surgical  Other Topics Concern   Not on file  Social History Narrative   Not on file   Social Determinants of Health   Financial Resource Strain: Not on file  Food Insecurity: Not on file  Transportation Needs: Not on file  Physical Activity: Not on file  Stress: Not on file  Social Connections: Not on file  Intimate Partner Violence: Not on file    Review of Systems  All other systems reviewed and are negative.       Objective    BP 125/85   Pulse 75  Temp 98.1 F (36.7 C) (Oral)   Resp 16   Ht 5' 11"  (1.803 m)   Wt 265 lb 9.6 oz (120.5 kg)   SpO2 93%   BMI 37.04 kg/m   Physical Exam Vitals and nursing note reviewed.  Constitutional:      General: She is not in acute distress.    Appearance: She is obese.  HENT:     Head: Normocephalic and atraumatic.     Right Ear: Tympanic membrane, ear canal and external ear normal.     Left Ear: Tympanic membrane, ear canal and external ear normal.     Nose: Nose normal.     Mouth/Throat:     Mouth: Mucous membranes are moist.     Pharynx: Oropharynx is clear.  Eyes:     Conjunctiva/sclera: Conjunctivae normal.     Pupils: Pupils are equal, round, and reactive to light.  Neck:     Thyroid: No thyromegaly.  Cardiovascular:     Rate and Rhythm: Normal rate and regular rhythm.     Heart sounds: Normal heart sounds. No murmur heard. Pulmonary:     Effort: Pulmonary effort  is normal. No respiratory distress.     Breath sounds: Normal breath sounds.  Abdominal:     General: There is no distension.     Palpations: Abdomen is soft. There is no mass.     Tenderness: There is no abdominal tenderness.  Musculoskeletal:        General: Normal range of motion.     Cervical back: Normal range of motion and neck supple.  Skin:    General: Skin is warm and dry.  Neurological:     General: No focal deficit present.     Mental Status: She is alert and oriented to person, place, and time.  Psychiatric:        Mood and Affect: Mood normal.        Behavior: Behavior normal.     {Labs (Optional):23779}    Assessment & Plan:   1. Annual physical exam  - CMP14+EGFR  2. Screening for deficiency anemia  - CBC with Differential  3. Screening for lipid disorders  - Lipid Panel  4. Screening for endocrine/metabolic/immunity disorders - Hemoglobin A1c - Vitamin D, 25-hydroxy - TSH  5. Snoring  - PSG SLEEP STUDY    No follow-ups on file.   Becky Sax, MD

## 2022-04-03 LAB — CBC WITH DIFFERENTIAL/PLATELET
Basophils Absolute: 0.1 10*3/uL (ref 0.0–0.2)
Basos: 1 %
EOS (ABSOLUTE): 0.2 10*3/uL (ref 0.0–0.4)
Eos: 2 %
Hematocrit: 36.6 % (ref 34.0–46.6)
Hemoglobin: 12 g/dL (ref 11.1–15.9)
Immature Grans (Abs): 0 10*3/uL (ref 0.0–0.1)
Immature Granulocytes: 0 %
Lymphocytes Absolute: 2.6 10*3/uL (ref 0.7–3.1)
Lymphs: 31 %
MCH: 26.2 pg — ABNORMAL LOW (ref 26.6–33.0)
MCHC: 32.8 g/dL (ref 31.5–35.7)
MCV: 80 fL (ref 79–97)
Monocytes Absolute: 0.4 10*3/uL (ref 0.1–0.9)
Monocytes: 5 %
Neutrophils Absolute: 5.2 10*3/uL (ref 1.4–7.0)
Neutrophils: 61 %
Platelets: 307 10*3/uL (ref 150–450)
RBC: 4.58 x10E6/uL (ref 3.77–5.28)
RDW: 14.7 % (ref 11.7–15.4)
WBC: 8.4 10*3/uL (ref 3.4–10.8)

## 2022-04-03 LAB — CMP14+EGFR
ALT: 15 IU/L (ref 0–32)
AST: 14 IU/L (ref 0–40)
Albumin/Globulin Ratio: 1.1 — ABNORMAL LOW (ref 1.2–2.2)
Albumin: 3.9 g/dL (ref 3.9–4.9)
Alkaline Phosphatase: 80 IU/L (ref 44–121)
BUN/Creatinine Ratio: 12 (ref 12–28)
BUN: 11 mg/dL (ref 8–27)
Bilirubin Total: <0.2 mg/dL (ref 0.0–1.2)
CO2: 25 mmol/L (ref 20–29)
Calcium: 9.3 mg/dL (ref 8.7–10.3)
Chloride: 101 mmol/L (ref 96–106)
Creatinine, Ser: 0.89 mg/dL (ref 0.57–1.00)
Globulin, Total: 3.4 g/dL (ref 1.5–4.5)
Glucose: 117 mg/dL — ABNORMAL HIGH (ref 70–99)
Potassium: 4.2 mmol/L (ref 3.5–5.2)
Sodium: 139 mmol/L (ref 134–144)
Total Protein: 7.3 g/dL (ref 6.0–8.5)
eGFR: 72 mL/min/{1.73_m2} (ref >59)

## 2022-04-03 LAB — LIPID PANEL
Chol/HDL Ratio: 3.1 ratio (ref 0.0–4.4)
Cholesterol, Total: 169 mg/dL (ref 100–199)
HDL: 55 mg/dL (ref >39)
LDL Chol Calc (NIH): 102 mg/dL — ABNORMAL HIGH (ref 0–99)
Triglycerides: 60 mg/dL (ref 0–149)
VLDL Cholesterol Cal: 12 mg/dL (ref 5–40)

## 2022-04-03 LAB — HEMOGLOBIN A1C
Est. average glucose Bld gHb Est-mCnc: 128 mg/dL
Hgb A1c MFr Bld: 6.1 % — ABNORMAL HIGH (ref 4.8–5.6)

## 2022-04-03 LAB — TSH: TSH: 7.7 u[IU]/mL — ABNORMAL HIGH (ref 0.450–4.500)

## 2022-04-03 LAB — VITAMIN D 25 HYDROXY (VIT D DEFICIENCY, FRACTURES): Vit D, 25-Hydroxy: 28.7 ng/mL — ABNORMAL LOW (ref 30.0–100.0)

## 2022-04-04 ENCOUNTER — Other Ambulatory Visit: Payer: Self-pay | Admitting: *Deleted

## 2022-04-04 DIAGNOSIS — R7989 Other specified abnormal findings of blood chemistry: Secondary | ICD-10-CM

## 2022-04-04 MED ORDER — VITAMIN D (ERGOCALCIFEROL) 1.25 MG (50000 UNIT) PO CAPS: 50000.0000 [IU] | ORAL_CAPSULE | ORAL | 0 refills | Status: DC

## 2022-04-05 LAB — T4, FREE: Free T4: 0.9 ng/dL (ref 0.82–1.77)

## 2022-04-05 LAB — SPECIMEN STATUS REPORT

## 2022-04-16 ENCOUNTER — Telehealth: Payer: Self-pay | Admitting: Family Medicine

## 2022-04-16 NOTE — Telephone Encounter (Signed)
Copied from [place] [id number]. Topic: General - Other >> Apr 16, 2022  8:45 AM Tiffany B wrote: Reason for CRM: patient checking on the status of sleep study orders. Patient would like a follow up to call regarding where PCP placed orders.

## 2022-04-16 NOTE — Telephone Encounter (Signed)
Hi, Are you able to check on this for me?

## 2022-04-17 ENCOUNTER — Encounter: Payer: Self-pay | Admitting: *Deleted

## 2022-04-17 ENCOUNTER — Other Ambulatory Visit: Payer: Self-pay | Admitting: *Deleted

## 2022-04-17 DIAGNOSIS — R0683 Snoring: Secondary | ICD-10-CM

## 2022-04-18 ENCOUNTER — Other Ambulatory Visit: Payer: Self-pay | Admitting: Family Medicine

## 2022-04-18 ENCOUNTER — Other Ambulatory Visit (HOSPITAL_COMMUNITY): Payer: Self-pay

## 2022-04-18 MED ORDER — LOSARTAN POTASSIUM 100 MG PO TABS
100.0000 mg | ORAL_TABLET | Freq: Every day | ORAL | 0 refills | Status: DC
  Filled 2022-04-18: qty 90, 90d supply, fill #0

## 2022-04-18 MED ORDER — HYDROCHLOROTHIAZIDE 25 MG PO TABS
25.0000 mg | ORAL_TABLET | Freq: Every day | ORAL | 0 refills | Status: DC
  Filled 2022-04-18: qty 90, 90d supply, fill #0

## 2022-04-18 MED ORDER — AMLODIPINE BESYLATE 10 MG PO TABS
10.0000 mg | ORAL_TABLET | Freq: Every day | ORAL | 0 refills | Status: DC
  Filled 2022-04-18: qty 90, 90d supply, fill #0

## 2022-04-24 ENCOUNTER — Ambulatory Visit: Payer: No Typology Code available for payment source

## 2022-05-02 ENCOUNTER — Ambulatory Visit
Admission: RE | Admit: 2022-05-02 | Discharge: 2022-05-02 | Disposition: A | Discharge status: Home / Self Care | Payer: No Typology Code available for payment source | Source: Ambulatory Visit | Attending: Family Medicine | Admitting: Family Medicine

## 2022-05-02 DIAGNOSIS — Z1231 Encounter for screening mammogram for malignant neoplasm of breast: Secondary | ICD-10-CM

## 2022-05-07 ENCOUNTER — Institutional Professional Consult (permissible substitution): Payer: No Typology Code available for payment source | Admitting: Adult Health

## 2022-05-15 ENCOUNTER — Ambulatory Visit (INDEPENDENT_AMBULATORY_CARE_PROVIDER_SITE_OTHER): Payer: No Typology Code available for payment source | Admitting: Adult Health

## 2022-05-15 ENCOUNTER — Encounter: Payer: Self-pay | Admitting: Adult Health

## 2022-05-15 VITALS — BP 128/74 | HR 68 | Ht 71.0 in | Wt 268.9 lb

## 2022-05-15 DIAGNOSIS — G4726 Circadian rhythm sleep disorder, shift work type: Secondary | ICD-10-CM | POA: Diagnosis not present

## 2022-05-15 DIAGNOSIS — R0683 Snoring: Secondary | ICD-10-CM | POA: Diagnosis not present

## 2022-05-15 NOTE — Patient Instructions (Signed)
Set up for Home sleep study  Healthy sleep regimen  Work on healthy weight loss  Do not drive if sleepy  Follow up in 2 months to review results and treatment plan

## 2022-05-15 NOTE — Progress Notes (Signed)
@Patient  ID: Amy Schultz, female    DOB: [birth year], 66 y.o.   MRN: [id number]  Chief Complaint  Patient presents with   Consult    Referring provider: Dorna Mai, MD  HPI: 66 year old female seen for sleep consult May 15, 2022 for loud snoring and daytime sleepiness  TEST/EVENTS :   05/15/22 Sleep consult  Patient presents for a sleep consult today.  Kindly referred by primary care provider Dr. Redmond Pulling.  Patient complains that she has had reported loud snoring has significant daytime sleepiness.  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  She works 4 days off 12-hour shifts 7 PM to 7 AM.  And is off 3 days.  She has done this for the last 4 years.  She said that on her days off that she typically goes back to regular time and sleeps at nighttime.  She says no matter what she does she only sleeps for 3 to 4-hour increments which she considers like a nap she will get up and do some things and lay back down she has very hard time staying asleep more than a few hours at a time.  She does not use any sleep aids.  She does not drink any excessive caffeine.  She has no removable dental work.  No history of congestive heart failure or stroke.  She has never had a previous sleep study before.  Weight is up 10 pounds current weight is at 268 pounds with a BMI at 37.  Epworth score is 4 out of 24.  Typically gets sleepy if she sits down to watch TV.  No symptoms suspicious for cataplexy or sleep paralysis.  Social history patient lives by herself.  She is a Chartered certified accountant at [employer].  She has 3 adult children.  She is single.  She is a never smoker.  No alcohol or drug use.  Family history positive for asthma, heart disease and cancer  Surgical history positive for thyroid nodule resection/benign    Allergies  Allergen Reactions   Codeine Nausea Only   Darvocet [Propoxyphene N-Acetaminophen] Nausea Only   Demerol Nausea Only   Hydrocodone Nausea Only   Penicillins  Nausea Only    Immunization History  Administered Date(s) Administered   DTP 01/13/2009   Influenza,inj,Quad PF,6+ Mos 04/27/2019   Influenza-Unspecified 03/26/2018, 06/25/2020   PFIZER(Purple Top)SARS-COV-2 Vaccination 05/16/2020, 06/09/2020   Tdap 08/27/2015    Past Medical History:  Diagnosis Date   Hypertension    Postmenopausal bleeding    Rash    both ankles x 3 weeks prescribed hydroxyazine prn and kenalog cream at urgent care 11-03-2019   Thyroid disease     Tobacco History: Social History   Tobacco Use  Smoking Status Never  Smokeless Tobacco Never   Counseling given: Not Answered   Outpatient Medications Prior to Visit  Medication Sig Dispense Refill   amLODipine (NORVASC) 10 MG tablet Take 1 tablet (10 mg total) by mouth daily. 90 tablet 0   aspirin 81 MG chewable tablet Chew by mouth daily.     hydrochlorothiazide (HYDRODIURIL) 25 MG tablet Take 1 tablet (25 mg total) by mouth daily. 90 tablet 0   losartan (COZAAR) 100 MG tablet Take 1 tablet (100 mg total) by mouth daily. 90 tablet 0   Vitamin D, Ergocalciferol, (DRISDOL) 1.25 MG (50000 UNIT) CAPS capsule Take 1 capsule (50,000 Units total) by mouth every 7 (seven) days. 12 capsule 0   COVID-19 At Home Antigen Test [facility]  COVID-19 HOME TEST) KIT Use as directed (Patient not taking: Reported on 05/15/2022) 4 each 0   phentermine (ADIPEX-P) 37.5 MG tablet Take 1 tablet (37.5 mg total) by mouth daily before breakfast. (Patient not taking: Reported on 05/15/2022) 30 tablet 0   triamcinolone cream (KENALOG) 0.1 % Apply 1 application topically 2 (two) times daily. (Patient not taking: Reported on 05/15/2022) 45 g 0   No facility-administered medications prior to visit.     Review of Systems:   Constitutional:   No  weight loss, night sweats,  Fevers, chills, fatigue, or  lassitude.  HEENT:   No headaches,  Difficulty swallowing,  Tooth/dental problems, or  Sore throat,                No sneezing, itching, ear  ache, nasal congestion, post nasal drip,   CV:  No chest pain,  Orthopnea, PND, swelling in lower extremities, anasarca, dizziness, palpitations, syncope.   GI  No heartburn, indigestion, abdominal pain, nausea, vomiting, diarrhea, change in bowel habits, loss of appetite, bloody stools.   Resp: No shortness of breath with exertion or at rest.  No excess mucus, no productive cough,  No non-productive cough,  No coughing up of blood.  No change in color of mucus.  No wheezing.  No chest wall deformity  Skin: no rash or lesions.  GU: no dysuria, change in color of urine, no urgency or frequency.  No flank pain, no hematuria   MS:  No joint pain or swelling.  No decreased range of motion.  No back pain.    Physical Exam  BP 128/74   Pulse 68   Ht 5' 11"  (1.803 m)   Wt 268 lb 14.4 oz (122 kg)   SpO2 97%   BMI 37.50 kg/m   GEN: A/Ox3; pleasant , NAD, well nourished    HEENT:  [place]/AT,   NOSE-clear, THROAT-clear, no lesions, no postnasal drip or exudate noted. Class 3 MP airway   NECK:  Supple w/ fair ROM; no JVD; normal carotid impulses w/o bruits; no thyromegaly or nodules palpated; no lymphadenopathy.    RESP  Clear  P & A; w/o, wheezes/ rales/ or rhonchi. no accessory muscle use, no dullness to percussion  CARD:  RRR, no m/r/g, no peripheral edema, pulses intact, no cyanosis or clubbing.  GI:   Soft & nt; nml bowel sounds; no organomegaly or masses detected.   Musco: Warm bil, no deformities or joint swelling noted.   Neuro: alert, no focal deficits noted.    Skin: Warm, no lesions or rashes    Lab Results:   BNP No results found for: "BNP"  ProBNP No results found for: "PROBNP"  Imaging:        No data to display          No results found for: "NITRICOXIDE"      Assessment & Plan:   No problem-specific Assessment & Plan notes found for this encounter.     Rexene Edison, NP 05/15/2022

## 2022-05-16 DIAGNOSIS — R0683 Snoring: Secondary | ICD-10-CM | POA: Insufficient documentation

## 2022-05-16 DIAGNOSIS — G4726 Circadian rhythm sleep disorder, shift work type: Secondary | ICD-10-CM | POA: Insufficient documentation

## 2022-05-16 NOTE — Assessment & Plan Note (Signed)
Patient has very fragmented sleep with underlying shift work.  Patient education was given with helpful hints to try to improve her sleep hygiene.  Plan  Patient Instructions  Set up for Home sleep study  Healthy sleep regimen  Work on healthy weight loss  Do not drive if sleepy  Follow up in 2 months to review results and treatment plan

## 2022-05-16 NOTE — Assessment & Plan Note (Signed)
Snoring, daytime sleepiness, BMI 37, fragmented sleep, restless sleep all concerning for under sleep apnea.  We will set patient up for home sleep study.  Patient education was given  - discussed how weight can impact sleep and risk for sleep disordered breathing - discussed options to assist with weight loss: combination of diet modification, cardiovascular and strength training exercises   - had an extensive discussion regarding the adverse health consequences related to untreated sleep disordered breathing - specifically discussed the risks for hypertension, coronary artery disease, cardiac dysrhythmias, cerebrovascular disease, and diabetes - lifestyle modification discussed   - discussed how sleep disruption can increase risk of accidents, particularly when driving - safe driving practices were discussed   Plan  Patient Instructions  Set up for Home sleep study  Healthy sleep regimen  Work on healthy weight loss  Do not drive if sleepy  Follow up in 2 months to review results and treatment plan

## 2022-05-17 NOTE — Progress Notes (Signed)
Reviewed and agree with assessment/plan.   Chesley Mires, MD [facility] Pulmonary/Critical Care 05/17/2022, 7:11 AM Pager:  [phone]

## 2022-05-30 IMAGING — DX DG ABDOMEN 1V
2 series · 2 of 2 positions shown · non-contrast
Comparison: None.

CLINICAL DATA: Lower abdominal pain.

EXAM:
ABDOMEN - 1 VIEW

[abdomen kub (1 of 2)]
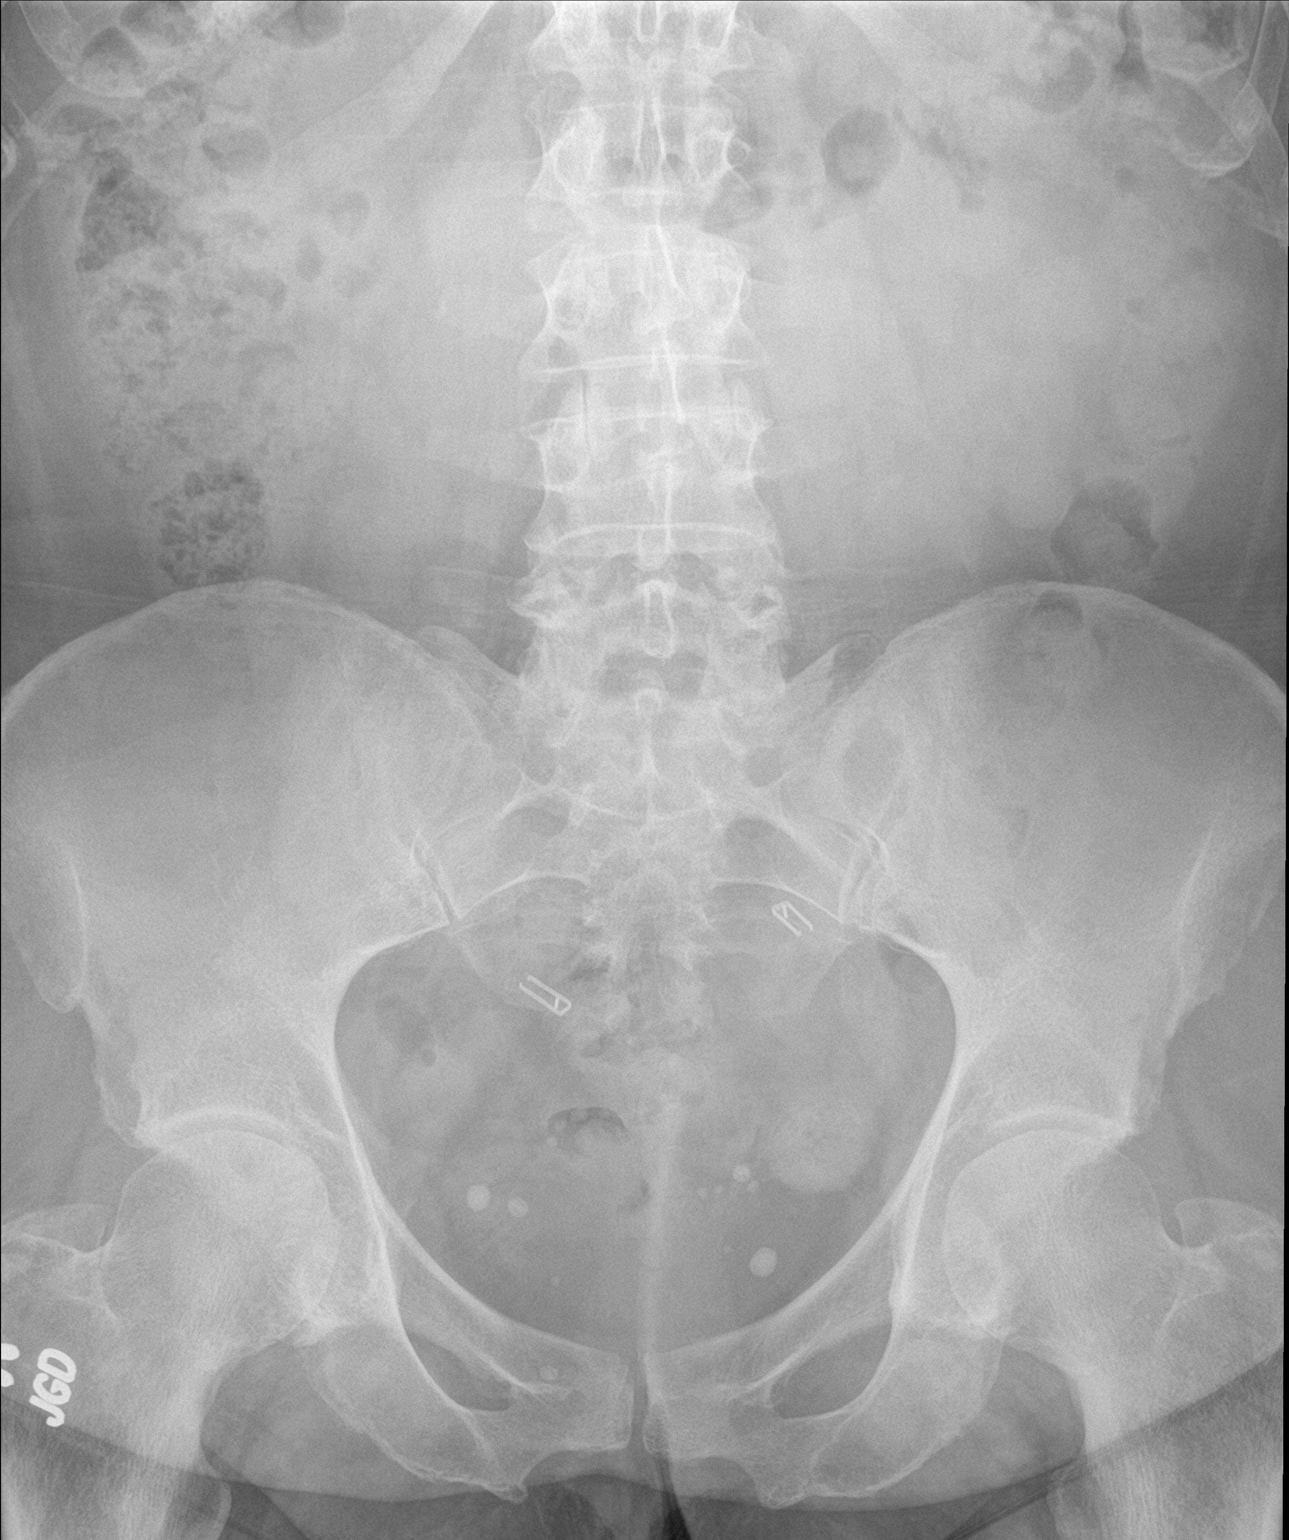

[abdomen kub (2 of 2)]
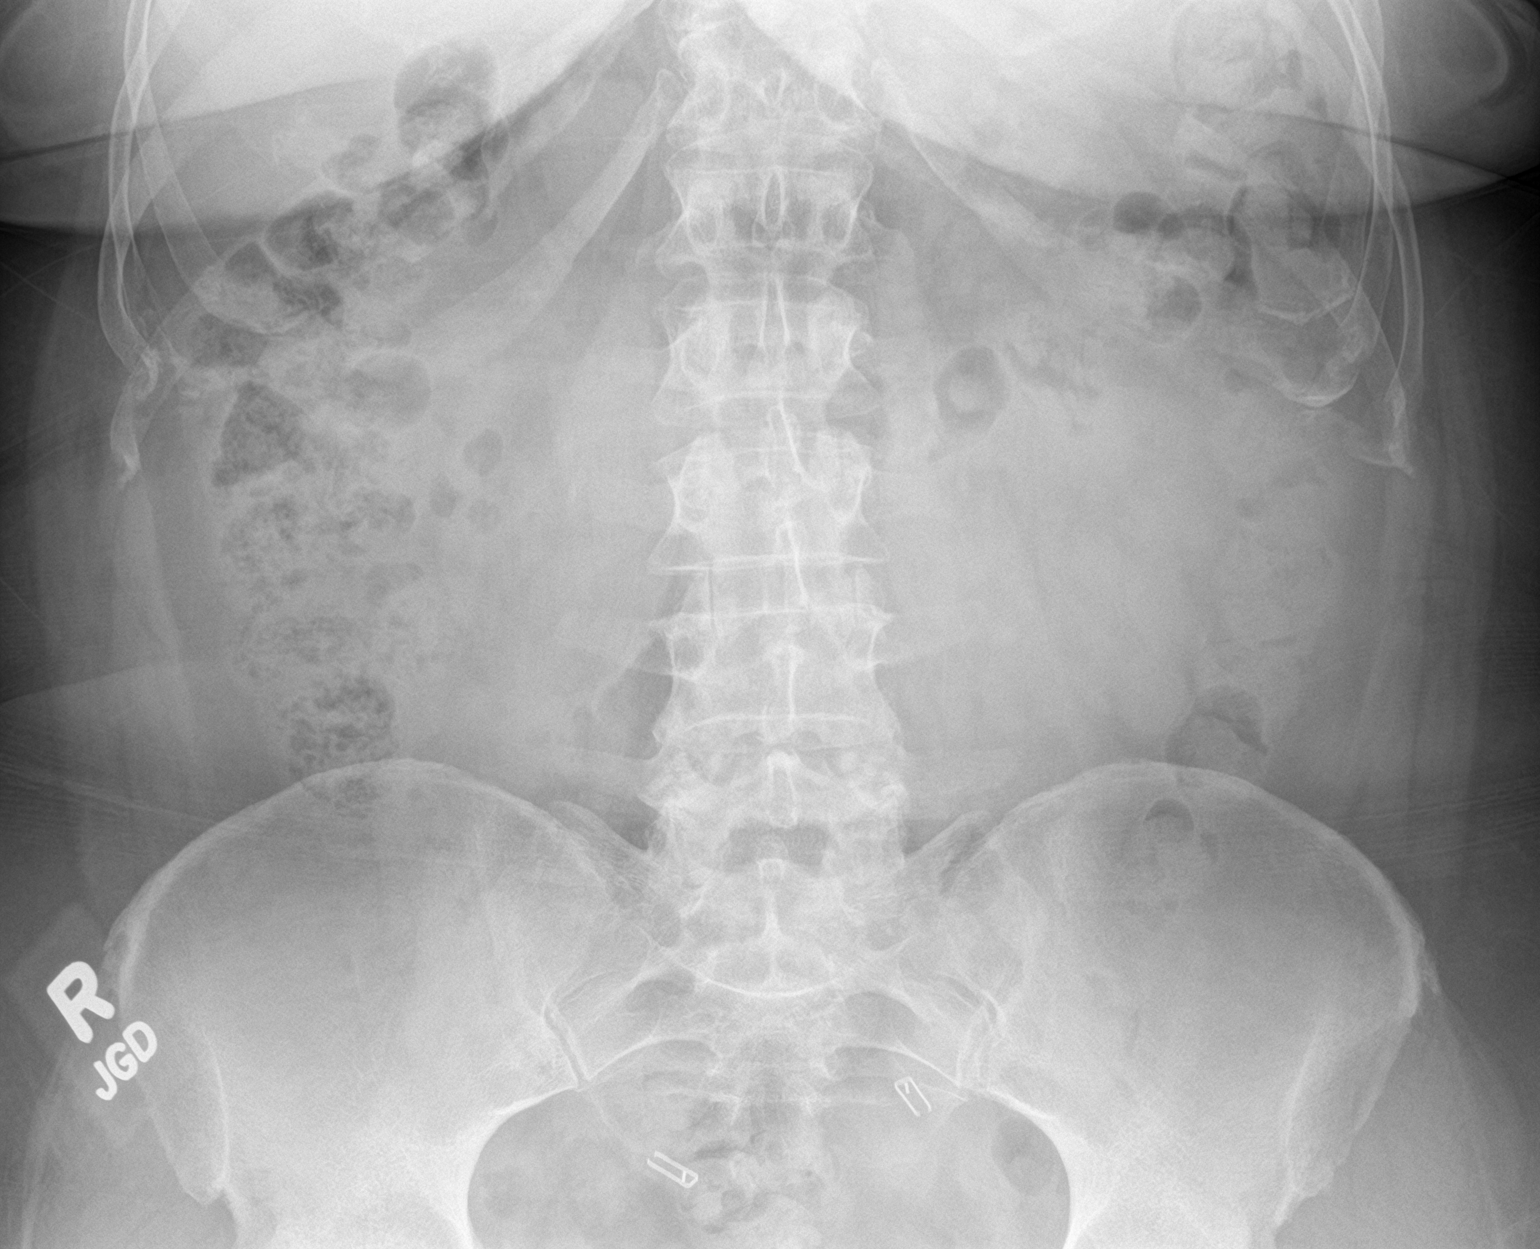

[2 of 2 positions shown; findings below may reference images not displayed]

FINDINGS: The bowel gas pattern is normal. Phleboliths are noted in the
pelvis. Mild amount of stool seen throughout the colon and rectum.
IMPRESSION: Mild stool burden.  No abnormal bowel dilatation.

## 2022-06-27 ENCOUNTER — Telehealth: Payer: Self-pay | Admitting: *Deleted

## 2022-06-27 NOTE — Telephone Encounter (Signed)
checking on the status of her CPAP machine. Please call and advise patient.  [phone]

## 2022-07-11 ENCOUNTER — Other Ambulatory Visit: Payer: Self-pay | Admitting: Family Medicine

## 2022-07-12 ENCOUNTER — Other Ambulatory Visit (HOSPITAL_COMMUNITY): Payer: Self-pay

## 2022-07-12 MED ORDER — AMLODIPINE BESYLATE 10 MG PO TABS
10.0000 mg | ORAL_TABLET | Freq: Every day | ORAL | 0 refills | Status: DC
  Filled 2022-07-12: qty 90, 90d supply, fill #0

## 2022-07-12 MED ORDER — LOSARTAN POTASSIUM 100 MG PO TABS
100.0000 mg | ORAL_TABLET | Freq: Every day | ORAL | 0 refills | Status: DC
  Filled 2022-07-12: qty 90, 90d supply, fill #0

## 2022-07-12 MED ORDER — HYDROCHLOROTHIAZIDE 25 MG PO TABS
25.0000 mg | ORAL_TABLET | Freq: Every day | ORAL | 0 refills | Status: DC
  Filled 2022-07-12: qty 90, 90d supply, fill #0

## 2022-07-12 NOTE — Telephone Encounter (Signed)
Requested medication (s) are due for refill today: yes  Requested medication (s) are on the active medication list: yes  Last refill:  01/04/22  Future visit scheduled: no  Notes to clinic:  Unable to refill per protocol, cannot delegate.      Requested Prescriptions  Pending Prescriptions Disp Refills   phentermine (ADIPEX-P) 37.5 MG tablet 30 tablet 0    Sig: Take 1 tablet (37.5 mg total) by mouth daily before breakfast.     Not Delegated - Neurology: Anticonvulsants - Controlled - phentermine hydrochloride Failed - 07/11/2022 11:50 PM      Failed - This refill cannot be delegated      Passed - eGFR in normal range and within 360 days    GFR calc Af Amer  Date Value Ref Range Status  09/19/2020 93 >59 mL/min/1.73 Final    Comment:    **In accordance with recommendations from the NKF-ASN Task force,**   [facility] is in the process of updating its eGFR calculation to the   2021 CKD-EPI creatinine equation that estimates kidney function   without a race variable.    GFR calc non Af Amer  Date Value Ref Range Status  09/19/2020 80 >59 mL/min/1.73 Final   eGFR  Date Value Ref Range Status  04/02/2022 72 >59 mL/min/1.73 Final         Passed - Cr in normal range and within 360 days    Creatinine, Ser  Date Value Ref Range Status  04/02/2022 0.89 0.57 - 1.00 mg/dL Final         Passed - Last BP in normal range    BP Readings from Last 1 Encounters:  05/15/22 128/74         Passed - Valid encounter within last 6 months    Recent Outpatient Visits           3 months ago Annual physical exam   Primary Care at [facility], MD   6 months ago Snoring   Primary Care at [facility], Clyde Canterbury, MD   10 months ago Essential hypertension   Primary Care at [facility], MD   1 year ago Essential hypertension   Primary Care at [facility], MD   1 year ago Essential hypertension   Primary Care at [facility], Clyde Canterbury, MD              Passed - Weight completed in the last 3 months    Wt Readings from Last 1 Encounters:  05/15/22 268 lb 14.4 oz (122 kg)         Signed Prescriptions Disp Refills   hydrochlorothiazide (HYDRODIURIL) 25 MG tablet 90 tablet 0    Sig: Take 1 tablet (25 mg total) by mouth daily.     Cardiovascular: Diuretics - Thiazide Passed - 07/11/2022 11:50 PM      Passed - Cr in normal range and within 180 days    Creatinine, Ser  Date Value Ref Range Status  04/02/2022 0.89 0.57 - 1.00 mg/dL Final         Passed - K in normal range and within 180 days    Potassium  Date Value Ref Range Status  04/02/2022 4.2 3.5 - 5.2 mmol/L Final         Passed - Na in normal range and within 180 days    Sodium  Date Value Ref Range Status  04/02/2022 139 134 - 144 mmol/L Final  Passed - Last BP in normal range    BP Readings from Last 1 Encounters:  05/15/22 128/74         Passed - Valid encounter within last 6 months    Recent Outpatient Visits           3 months ago Annual physical exam   Primary Care at [facility], MD   6 months ago Snoring   Primary Care at [facility], MD   10 months ago Essential hypertension   Primary Care at [facility], MD   1 year ago Essential hypertension   Primary Care at [facility], MD   1 year ago Essential hypertension   Primary Care at [place], [place]               losartan (COZAAR) 100 MG tablet 90 tablet 0    Sig: Take 1 tablet (100 mg total) by mouth daily.     Cardiovascular:  Angiotensin Receptor Blockers Passed - 07/11/2022 11:50 PM      Passed - Cr in normal range and within 180 days    Creatinine, Ser  Date Value Ref Range Status  04/02/2022 0.89 0.57 - 1.00 mg/dL Final         Passed - K in normal range and within 180 days    Potassium  Date Value Ref Range Status  04/02/2022 4.2 3.5 - 5.2 mmol/L  Final         Passed - Patient is not pregnant      Passed - Last BP in normal range    BP Readings from Last 1 Encounters:  05/15/22 128/74         Passed - Valid encounter within last 6 months    Recent Outpatient Visits           3 months ago Annual physical exam   Primary Care at [facility], MD   6 months ago Snoring   Primary Care at [facility], MD   10 months ago Essential hypertension   Primary Care at [facility], MD   1 year ago Essential hypertension   Primary Care at [facility], MD   1 year ago Essential hypertension   Primary Care at [place], MD               amLODipine (NORVASC) 10 MG tablet 90 tablet 0    Sig: Take 1 tablet (10 mg total) by mouth daily.     Cardiovascular: Calcium Channel Blockers 2 Passed - 07/11/2022 11:50 PM      Passed - Last BP in normal range    BP Readings from Last 1 Encounters:  05/15/22 128/74         Passed - Last Heart Rate in normal range    Pulse Readings from Last 1 Encounters:  05/15/22 68         Passed - Valid encounter within last 6 months    Recent Outpatient Visits           3 months ago Annual physical exam   Primary Care at [facility], MD   6 months ago Snoring   Primary Care at [facility], MD   10 months ago Essential hypertension   Primary Care at [facility], MD   1 year ago Essential hypertension   Primary Care  at [facility], MD   1 year ago Essential hypertension   Primary Care at [facility], MD

## 2022-07-12 NOTE — Telephone Encounter (Signed)
Requested Prescriptions  Pending Prescriptions Disp Refills   phentermine (ADIPEX-P) 37.5 MG tablet 30 tablet 0    Sig: Take 1 tablet (37.5 mg total) by mouth daily before breakfast.     Not Delegated - Neurology: Anticonvulsants - Controlled - phentermine hydrochloride Failed - 07/11/2022 11:50 PM      Failed - This refill cannot be delegated      Passed - eGFR in normal range and within 360 days    GFR calc Af Amer  Date Value Ref Range Status  09/19/2020 93 >59 mL/min/1.73 Final    Comment:    **In accordance with recommendations from the NKF-ASN Task force,**   [facility] is in the process of updating its eGFR calculation to the   2021 CKD-EPI creatinine equation that estimates kidney function   without a race variable.    GFR calc non Af Amer  Date Value Ref Range Status  09/19/2020 80 >59 mL/min/1.73 Final   eGFR  Date Value Ref Range Status  04/02/2022 72 >59 mL/min/1.73 Final         Passed - Cr in normal range and within 360 days    Creatinine, Ser  Date Value Ref Range Status  04/02/2022 0.89 0.57 - 1.00 mg/dL Final         Passed - Last BP in normal range    BP Readings from Last 1 Encounters:  05/15/22 128/74         Passed - Valid encounter within last 6 months    Recent Outpatient Visits           3 months ago Annual physical exam   Primary Care at [facility], MD   6 months ago Snoring   Primary Care at [facility], Clyde Canterbury, MD   10 months ago Essential hypertension   Primary Care at [facility], MD   1 year ago Essential hypertension   Primary Care at [facility], MD   1 year ago Essential hypertension   Primary Care at [facility], Clyde Canterbury, MD              Passed - Weight completed in the last 3 months    Wt Readings from Last 1 Encounters:  05/15/22 268 lb 14.4 oz (122 kg)          hydrochlorothiazide (HYDRODIURIL) 25 MG tablet 90 tablet 0    Sig: Take 1 tablet  (25 mg total) by mouth daily.     Cardiovascular: Diuretics - Thiazide Passed - 07/11/2022 11:50 PM      Passed - Cr in normal range and within 180 days    Creatinine, Ser  Date Value Ref Range Status  04/02/2022 0.89 0.57 - 1.00 mg/dL Final         Passed - K in normal range and within 180 days    Potassium  Date Value Ref Range Status  04/02/2022 4.2 3.5 - 5.2 mmol/L Final         Passed - Na in normal range and within 180 days    Sodium  Date Value Ref Range Status  04/02/2022 139 134 - 144 mmol/L Final         Passed - Last BP in normal range    BP Readings from Last 1 Encounters:  05/15/22 128/74         Passed - Valid encounter within last 6 months    Recent Outpatient Visits  3 months ago Annual physical exam   Primary Care at [facility], MD   6 months ago Snoring   Primary Care at [facility], MD   10 months ago Essential hypertension   Primary Care at [facility], MD   1 year ago Essential hypertension   Primary Care at [facility], MD   1 year ago Essential hypertension   Primary Care at [place], [place]               losartan (COZAAR) 100 MG tablet 90 tablet 0    Sig: Take 1 tablet (100 mg total) by mouth daily.     Cardiovascular:  Angiotensin Receptor Blockers Passed - 07/11/2022 11:50 PM      Passed - Cr in normal range and within 180 days    Creatinine, Ser  Date Value Ref Range Status  04/02/2022 0.89 0.57 - 1.00 mg/dL Final         Passed - K in normal range and within 180 days    Potassium  Date Value Ref Range Status  04/02/2022 4.2 3.5 - 5.2 mmol/L Final         Passed - Patient is not pregnant      Passed - Last BP in normal range    BP Readings from Last 1 Encounters:  05/15/22 128/74         Passed - Valid encounter within last 6 months    Recent Outpatient Visits           3 months ago Annual physical exam   Primary  Care at [facility], MD   6 months ago Snoring   Primary Care at [facility], MD   10 months ago Essential hypertension   Primary Care at [facility], MD   1 year ago Essential hypertension   Primary Care at [facility], MD   1 year ago Essential hypertension   Primary Care at [place], [place]               amLODipine (NORVASC) 10 MG tablet 90 tablet 0    Sig: Take 1 tablet (10 mg total) by mouth daily.     Cardiovascular: Calcium Channel Blockers 2 Passed - 07/11/2022 11:50 PM      Passed - Last BP in normal range    BP Readings from Last 1 Encounters:  05/15/22 128/74         Passed - Last Heart Rate in normal range    Pulse Readings from Last 1 Encounters:  05/15/22 68         Passed - Valid encounter within last 6 months    Recent Outpatient Visits           3 months ago Annual physical exam   Primary Care at [facility], MD   6 months ago Snoring   Primary Care at [facility], MD   10 months ago Essential hypertension   Primary Care at [facility], MD   1 year ago Essential hypertension   Primary Care at [facility], MD   1 year ago Essential hypertension   Primary Care at [facility], MD

## 2022-08-09 ENCOUNTER — Ambulatory Visit: Payer: No Typology Code available for payment source

## 2022-08-09 DIAGNOSIS — G4733 Obstructive sleep apnea (adult) (pediatric): Secondary | ICD-10-CM | POA: Diagnosis not present

## 2022-08-09 DIAGNOSIS — R0683 Snoring: Secondary | ICD-10-CM

## 2022-08-12 ENCOUNTER — Other Ambulatory Visit: Payer: Self-pay | Admitting: Family Medicine

## 2022-08-12 ENCOUNTER — Other Ambulatory Visit (HOSPITAL_COMMUNITY): Payer: Self-pay

## 2022-08-14 ENCOUNTER — Other Ambulatory Visit (HOSPITAL_COMMUNITY): Payer: Self-pay

## 2022-08-14 MED ORDER — TRIAMCINOLONE ACETONIDE 0.1 % EX CREA
1.0000 | TOPICAL_CREAM | Freq: Two times a day (BID) | CUTANEOUS | 0 refills | Status: DC
  Filled 2022-08-14: qty 45, 30d supply, fill #0

## 2022-08-20 ENCOUNTER — Ambulatory Visit: Payer: Self-pay | Admitting: *Deleted

## 2022-08-20 NOTE — Telephone Encounter (Signed)
Message from [place] Call[place] sent at 08/20/2022  4:52 PM EST  Summary: swelling ankles and calfs   Pt called saying her ankles are swollen and there are no appt until jan 3rd.  Please advise  CB@  [phone]         Attempted to call patient. No answer, left message to return call.

## 2022-08-21 NOTE — Telephone Encounter (Signed)
Chief Complaint: Swelling to lower legs around ankles Symptoms: Rash to both ankles, pain 5/10 when touched Frequency: Onset of swelling 1 week ago, worse past couple of days Pertinent Negatives: Patient denies swelling to legs, no calf pain, no other symptoms Disposition: '[]'$ ED /'[]'$ Urgent Care (no appt availability in office) / '[x]'$ Appointment(In office/virtual)/ '[]'$  [place] [facility]/ '[]'$ Home Care/ '[]'$ Refused Recommended Disposition /'[]'$ [place] [facility]/ '[]'$  Follow-up with PCP Additional Notes: First available showing with Dr. Redmond Pulling on 09/18/22. She says she would like for Dr. Redmond Pulling to work her in prior to the 09/18/22 and if nothing could be done to see Dr. Redmond Pulling, she will take the first available with another provider, but Dr. Redmond Pulling is who is preferred. Advised I will send this to Dr. Redmond Pulling for approval.     Reason for Disposition  [1] MILD swelling of both ankles (i.e., pedal edema) AND [2] new-onset or worsening  Answer Assessment - Initial Assessment Questions 1. ONSET: "When did the swelling start?" (e.g., minutes, hours, days)     1 week, worse last few day 2. LOCATION: "What part of the leg is swollen?"  "Are both legs swollen or just one leg?"     Both legs both lower around the ankles 3. SEVERITY: "How bad is the swelling?" (e.g., localized; mild, moderate, severe)   - Localized: Small area of swelling localized to one leg.   - MILD pedal edema: Swelling limited to foot and ankle, pitting edema < 1/4 inch (6 mm) deep, rest and elevation eliminate most or all swelling.   - MODERATE edema: Swelling of lower leg to knee, pitting edema > 1/4 inch (6 mm) deep, rest and elevation only partially reduce swelling.   - SEVERE edema: Swelling extends above knee, facial or hand swelling present.      Mild 4. REDNESS: "Does the swelling look red or infected?"     Rash to both ankles 5. PAIN: "Is the swelling painful to touch?" If Yes, ask: "How painful is it?"   (Scale 1-10; mild,  moderate or severe)     5 6. FEVER: "Do you have a fever?" If Yes, ask: "What is it, how was it measured, and when did it start?"      No 7. MEDICAL HISTORY: "Do you have a history of blood clots (e.g., DVT), cancer, heart failure, kidney disease, or liver failure?"     No 8. OTHER SYMPTOMS: "Do you have any other symptoms?" (e.g., chest pain, difficulty breathing)       No  Protocols used: Leg Swelling and Edema-A-AH

## 2022-08-21 NOTE — Telephone Encounter (Signed)
noted 

## 2022-09-05 ENCOUNTER — Ambulatory Visit: Payer: Self-pay

## 2022-09-05 NOTE — Telephone Encounter (Signed)
  Chief Complaint: boil right breast- pain Symptoms: yellow exudate on top draining yellow foul odor with swelling and redness Frequency: Thursday and busted on Sunday Pertinent Negatives: Patient denies fever  Disposition: '[]'$ ED /'[]'$ Urgent Care (no appt availability in office) / '[]'$ Appointment(In office/virtual)/ '[]'$  [place] [facility]/ '[]'$ Home Care/ '[]'$ Refused Recommended Disposition /'[]'$ [place] [facility]/ '[]'$  Follow-up with PCP Additional Notes: called office  asking for am appt. Pt added on to a 0820 am appt with Dr Redmond Pulling. Called pt and pt accepted appt.  Reason for Disposition  [1] Boil > 1/2 inch across (> 12 mm; larger than a marble) AND [2] center is soft or pus colored  Answer Assessment - Initial Assessment Questions 1. APPEARANCE of BOIL: "What does the boil look like?"      Yellow on top  swollen and red- foul odor -dark rown  2. LOCATION: "Where is the boil located?"      Right breast  top  3. NUMBER: "How many boils are there?"      1 4. SIZE: "How big is the boil?" (e.g., inches, cm; compare to size of a coin or other object)     Nickle sized  5. ONSET: "When did the boil start?"     Thursday busted Sunday  6. PAIN: "Is there any pain?" If Yes, ask: "How bad is the pain?"   (Scale 1-10; or mild, moderate, severe)     moderate 7. FEVER: "Do you have a fever?" If Yes, ask: "What is it, how was it measured, and when did it start?"      no 8. SOURCE: "Have you been around anyone with boils or other Staph infections?" "Have you ever had boils before?"     Daughter - yes 9. OTHER SYMPTOMS: "Do you have any other symptoms?" (e.g., shaking chills, weakness, rash elsewhere on body)     Ankle rash 10. PREGNANCY: "Is there any chance you are pregnant?" "When was your last menstrual period?"       N/a  Protocols used: Boil (Skin Abscess)-A-AH

## 2022-09-06 ENCOUNTER — Encounter: Payer: Self-pay | Admitting: Family Medicine

## 2022-09-06 ENCOUNTER — Ambulatory Visit (INDEPENDENT_AMBULATORY_CARE_PROVIDER_SITE_OTHER): Payer: 59 | Admitting: Family Medicine

## 2022-09-06 ENCOUNTER — Other Ambulatory Visit (HOSPITAL_COMMUNITY): Payer: Self-pay

## 2022-09-06 VITALS — BP 136/81 | HR 69 | Temp 98.8°F | Resp 16

## 2022-09-06 DIAGNOSIS — I10 Essential (primary) hypertension: Secondary | ICD-10-CM

## 2022-09-06 DIAGNOSIS — N611 Abscess of the breast and nipple: Secondary | ICD-10-CM

## 2022-09-06 MED ORDER — DOXYCYCLINE HYCLATE 100 MG PO TABS
100.0000 mg | ORAL_TABLET | Freq: Two times a day (BID) | ORAL | 0 refills | Status: DC
  Filled 2022-09-06: qty 14, 7d supply, fill #0

## 2022-09-06 NOTE — Progress Notes (Unsigned)
Patient is here for boil on her right breast that has has been present x1wk. Patient said it's ref and swollen ,smelly.

## 2022-09-07 ENCOUNTER — Telehealth: Payer: Self-pay | Admitting: Pulmonary Disease

## 2022-09-07 DIAGNOSIS — G4733 Obstructive sleep apnea (adult) (pediatric): Secondary | ICD-10-CM | POA: Diagnosis not present

## 2022-09-07 NOTE — Telephone Encounter (Signed)
Call patient  Sleep study result  Date of study: 08/11/2022  Impression: Moderate obstructive sleep apnea Moderate oxygen desaturations  Recommendation: DME referral  Recommend CPAP therapy for moderate obstructive sleep apnea  Auto titrating CPAP with pressure settings of 5-15 will be appropriate  Encourage weight loss measures  Follow-up in the office 4 to 6 weeks following initiation of treatment

## 2022-09-08 NOTE — Telephone Encounter (Signed)
Please set up ov to discuss results and treatment plan  In office or virtual can double book

## 2022-09-09 ENCOUNTER — Encounter: Payer: Self-pay | Admitting: Family Medicine

## 2022-09-09 NOTE — Telephone Encounter (Signed)
Just FYI I was able to get patient scheduled for 09/17/22 to discuss HST results. I scheduled her with Tammy, due to Dr. Ander Slade not having a opening until February. Pt verbalized understanding. Nothing further needed.

## 2022-09-09 NOTE — Progress Notes (Signed)
Established Patient Office Visit  Subjective    Patient ID: Amy Schultz, female    DOB: June 21, 1956  Age: 67 y.o. MRN: [id number]  CC:  Chief Complaint  Patient presents with   Follow-up   Hypertension    HPI [name] presents with complaints of abscess on breast for several days. Denies fever/chills. Denies known trauma or injury.    Outpatient Encounter Medications as of 09/06/2022  Medication Sig   amLODipine (NORVASC) 10 MG tablet Take 1 tablet (10 mg total) by mouth daily.   aspirin 81 MG chewable tablet Chew by mouth daily.   doxycycline (VIBRA-TABS) 100 MG tablet Take 1 tablet (100 mg total) by mouth 2 (two) times daily.   hydrochlorothiazide (HYDRODIURIL) 25 MG tablet Take 1 tablet (25 mg total) by mouth daily.   losartan (COZAAR) 100 MG tablet Take 1 tablet (100 mg total) by mouth daily.   triamcinolone cream (KENALOG) 0.1 % Apply 1 application topically 2 (two) times daily.   Vitamin D, Ergocalciferol, (DRISDOL) 1.25 MG (50000 UNIT) CAPS capsule Take 1 capsule (50,000 Units total) by mouth every 7 (seven) days.   COVID-19 At Home Antigen Test [facility] COVID-19 HOME TEST) KIT Use as directed (Patient not taking: Reported on 05/15/2022)   phentermine (ADIPEX-P) 37.5 MG tablet Take 1 tablet (37.5 mg total) by mouth daily before breakfast. (Patient not taking: Reported on 05/15/2022)   No facility-administered encounter medications on file as of 09/06/2022.    Past Medical History:  Diagnosis Date   Hypertension    Postmenopausal bleeding    Rash    both ankles x 3 weeks prescribed hydroxyazine prn and kenalog cream at urgent care 11-03-2019   Thyroid disease     Past Surgical History:  Procedure Laterality Date   COLONOSCOPY     DILATATION & CURETTAGE/HYSTEROSCOPY WITH MYOSURE N/A 11/10/2019   Procedure: [place];  Surgeon: Servando Salina, MD;  Location: [facility];  Service: Gynecology;  Laterality:  N/A;   THYROID SURGERY     didn't remove thyroid   TUBAL LIGATION      Family History  Problem Relation Age of Onset   Diabetes Mother    Hypertension Mother    Hypertension Sister    Breast cancer Sister        unsure age of onset   Breast cancer Maternal Aunt        unsure age of onset   Diabetes Maternal Grandmother    Colon cancer Neg Hx    Esophageal cancer Neg Hx    Rectal cancer Neg Hx    Stomach cancer Neg Hx    Colon polyps Neg Hx     Social History   Socioeconomic History   Marital status: Single    Spouse name: Not on file   Number of children: 3   Years of education: Not on file   Highest education level: Not on file  Occupational History   Not on file  Tobacco Use   Smoking status: Never   Smokeless tobacco: Never  Vaping Use   Vaping Use: Never used  Substance and Sexual Activity   Alcohol use: No   Drug use: No   Sexual activity: Not Currently    Birth control/protection: Surgical  Other Topics Concern   Not on file  Social History Narrative   Not on file   Social Determinants of Health   Financial Resource Strain: Not on file  Food Insecurity: Not on  file  Transportation Needs: Not on file  Physical Activity: Not on file  Stress: Not on file  Social Connections: Not on file  Intimate Partner Violence: Not on file    Review of Systems  All other systems reviewed and are negative.       Objective    BP 136/81   Pulse 69   Temp 98.8 F (37.1 C) (Oral)   Resp 16   SpO2 97%   Physical Exam Vitals and nursing note reviewed.  Constitutional:      General: She is not in acute distress. Cardiovascular:     Rate and Rhythm: Normal rate and regular rhythm.  Pulmonary:     Effort: Pulmonary effort is normal.     Breath sounds: Normal breath sounds.  Chest:     Comments: Abscess with purulence noted on right breast on slightly erythematous base. TTP Neurological:     General: No focal deficit present.     Mental Status: She is  alert and oriented to person, place, and time.         Assessment & Plan:   1. Abscess of breast Doxycycline prescribed  2. Essential hypertension Appears stable. continue    Return if symptoms worsen or fail to improve.   Becky Sax, MD

## 2022-09-12 ENCOUNTER — Ambulatory Visit: Payer: Self-pay | Admitting: *Deleted

## 2022-09-12 NOTE — Telephone Encounter (Signed)
  Chief Complaint: breast abscess- on antibiotic Symptoms: still draining- patient afraid infection has not completely cleared Frequency: started treatment 09/06/22- has 1 dose left of antibiotic Pertinent Negatives: Patient denies fever Disposition: '[]'$ ED /'[]'$ Urgent Care (no appt availability in office) / '[]'$ Appointment(In office/virtual)/ '[]'$  [place] [facility]/ '[]'$ Home Care/ '[]'$ Refused Recommended Disposition /'[]'$ [place] [facility]/ '[x]'$  Follow-up with PCP Additional Notes: no open appointment- patient is concerned she may need longer treatment- abscess still draining( pus) and patient states she feels the infection is not gone. It is better- but not completely gone.

## 2022-09-12 NOTE — Telephone Encounter (Signed)
Patient called to come in for appt 09/13/2022 '@8'$ :00 for evaluation of breast, per pcp LVM

## 2022-09-12 NOTE — Telephone Encounter (Signed)
Summary: Breast drainage advice   Pt is calling to report that she is still experiencing drainage in the breast after taking Rx #: [id number] doxycycline (VIBRA-TABS) 100 MG tablet [[id number] What should she do next?         Reason for Disposition  [1] Finished taking antibiotic AND [2] wound infection symptoms are BETTER BUT [3] not completely gone  Answer Assessment - Initial Assessment Questions 1. SYMPTOM: "What's the main symptom you're concerned about?" (e.g., redness, swelling, pain, fever, weakness)     Patient reports the abscess is better 2. WOUND INFECTION LOCATION: "Where is the wound infection located?" (e.g., arm, face, foot, knee, leg)     R breast 3. WOUND INFECTION SIZE: "What is the size of the red area?" (e.g., inches, centimeters; compare to size of a coin)      Dime size 4. BETTER-SAME-WORSE: "Are you getting better, staying the same, or getting worse compared to the day you started the antibiotics?"      Better- open and draining- decrease in drainage-still concerned there is infection present 5. PAIN: "Do you have any pain?"  If Yes, ask: "How bad is the pain?"  (e.g., Scale 1-10; mild, moderate, or severe)    - MILD (1-3): Doesn't interfere with normal activities.     - MODERATE (4-7): Interferes with normal activities or awakens from sleep.    - SEVERE (8-10): Excruciating pain, unable to do any normal activities.       mild 6. FEVER: "Do you have a fever?" If Yes, ask: "What is it, how was it measured and when did it start?"     no 7. OTHER SYMPTOMS: "Do you have any other symptoms?" (e.g., pus coming from a wound, red streaks, weakness)     Pus coming from abscess  8. DIAGNOSIS DATE: "When was the wound infection diagnosed?" "By whom?"      09/06/22 9. ANTIBIOTIC NAME: "What antibiotic(s) are you taking?"  "How many times a day?" Note: Be sure the patient is taking the antibiotic as directed.      Doxycycline 100 mg bid 10. ANTIBIOTIC DATE: "When was the  antibiotic started?"       09/06/22 11. FOLLOW-UP APPOINTMENT: "Do you have a follow-up appointment with your doctor?"       In 2 weeks  Protocols used: Wound Infection on Antibiotic Follow-up Call-A-AH

## 2022-09-13 ENCOUNTER — Ambulatory Visit (INDEPENDENT_AMBULATORY_CARE_PROVIDER_SITE_OTHER): Payer: 59 | Admitting: Podiatry

## 2022-09-13 ENCOUNTER — Ambulatory Visit (INDEPENDENT_AMBULATORY_CARE_PROVIDER_SITE_OTHER): Payer: 59 | Admitting: Family Medicine

## 2022-09-13 ENCOUNTER — Ambulatory Visit (INDEPENDENT_AMBULATORY_CARE_PROVIDER_SITE_OTHER): Payer: 59

## 2022-09-13 ENCOUNTER — Other Ambulatory Visit (HOSPITAL_COMMUNITY): Payer: Self-pay

## 2022-09-13 VITALS — BP 148/83 | Wt 268.0 lb

## 2022-09-13 VITALS — BP 148/83 | HR 68

## 2022-09-13 DIAGNOSIS — M2042 Other hammer toe(s) (acquired), left foot: Secondary | ICD-10-CM

## 2022-09-13 DIAGNOSIS — N611 Abscess of the breast and nipple: Secondary | ICD-10-CM | POA: Diagnosis not present

## 2022-09-13 NOTE — Progress Notes (Signed)
Patient is here for recheck of breast for pain and redness.

## 2022-09-13 NOTE — Progress Notes (Signed)
   Chief Complaint  Patient presents with   Hammer Toe    Left foot 5th toe corn, rate of pain 6 out of 10,     HPI: 67 y.o. female presenting today for follow-up evaluation of pain and tenderness associated to the fifth digit of the left foot.  She was last seen here in the office on 09/27/2021 for the same condition.  She has had pain and tenderness associated to this left fifth toe for several years now.  She has tried multiple conservative modalities including cortisone injections, debridement, and wide fitting shoes with no alleviation of her symptoms.  She presents for follow-up treatment evaluation  Past Medical History:  Diagnosis Date   Hypertension    Postmenopausal bleeding    Rash    both ankles x 3 weeks prescribed hydroxyazine prn and kenalog cream at urgent care 11-03-2019   Thyroid disease       Objective: Physical Exam General: The patient is alert and oriented x3 in no acute distress.  Dermatology: Skin is cool, dry and supple bilateral lower extremities. Negative for open lesions or macerations.  Vascular: Palpable pedal pulses bilaterally. No edema or erythema noted. Capillary refill within normal limits.  Neurological: Epicritic and protective threshold grossly intact bilaterally.   Musculoskeletal Exam: All pedal and ankle joints range of motion within normal limits bilateral. Muscle strength 5/5 in all groups bilateral. Hammertoe contracture deformity noted to digits fifth digit of the left foot.  Radiographic Exam LT foot 09/13/2022: Hammertoe contracture deformity noted to the fifth digit left foot      Assessment: 1.  Hammertoe fifth digit left foot   Plan of Care:  1. Patient evaluated. X-Rays reviewed.  2.  Today we discussed both conservative management of the symptomatic hammertoe to the left foot with overlying corn/callus as well as surgical management.  The patient has now dealt with this hammertoe and it has been symptomatic for several years.   There has been conservative treatment which has failed.  I do believe hammertoe arthroplasty/correction of the hammertoe is warranted at this time.  Risk benefits advantages and disadvantages were explained.  No guarantees were expressed or implied.  Postoperative recovery course also explained in detail 3.  Today authorization for surgery was initiated today.  Surgery will consist of PIPJ arthroplasty fifth digit left 4.  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.  Going to the beach 09/27/2022  Edrick Kins, DPM [facility]  Dr. Edrick Kins, DPM    [address], [place] [address]                Office [phone]  Fax [phone]

## 2022-09-16 ENCOUNTER — Encounter: Payer: Self-pay | Admitting: Family Medicine

## 2022-09-16 LAB — WOUND CULTURE

## 2022-09-16 LAB — SPECIMEN STATUS REPORT

## 2022-09-16 MED ORDER — DOXYCYCLINE HYCLATE 100 MG PO TABS: 100.0000 mg | ORAL_TABLET | Freq: Two times a day (BID) | ORAL | 0 refills | Status: DC

## 2022-09-16 NOTE — Progress Notes (Signed)
Established Patient Office Visit  Subjective    Patient ID: Amy Schultz, female    DOB: 1956/05/13  Age: 67 y.o. MRN: [id number]  CC:  Chief Complaint  Patient presents with   Follow-up    HPI Amy Schultz presents for follow up of breast abscess. She reports that she has minimal to moderate improvement.    Outpatient Encounter Medications as of 09/13/2022  Medication Sig   amLODipine (NORVASC) 10 MG tablet Take 1 tablet (10 mg total) by mouth daily.   aspirin 81 MG chewable tablet Chew by mouth daily.   COVID-19 At Home Antigen Test [facility] COVID-19 HOME TEST) KIT Use as directed (Patient not taking: Reported on 05/15/2022)   doxycycline (VIBRA-TABS) 100 MG tablet Take 1 tablet (100 mg total) by mouth 2 (two) times daily. (Patient not taking: Reported on 09/13/2022)   hydrochlorothiazide (HYDRODIURIL) 25 MG tablet Take 1 tablet (25 mg total) by mouth daily.   losartan (COZAAR) 100 MG tablet Take 1 tablet (100 mg total) by mouth daily.   phentermine (ADIPEX-P) 37.5 MG tablet Take 1 tablet (37.5 mg total) by mouth daily before breakfast. (Patient not taking: Reported on 05/15/2022)   triamcinolone cream (KENALOG) 0.1 % Apply 1 application topically 2 (two) times daily.   Vitamin D, Ergocalciferol, (DRISDOL) 1.25 MG (50000 UNIT) CAPS capsule Take 1 capsule (50,000 Units total) by mouth every 7 (seven) days. (Patient not taking: Reported on 09/13/2022)   No facility-administered encounter medications on file as of 09/13/2022.    Past Medical History:  Diagnosis Date   Hypertension    Postmenopausal bleeding    Rash    both ankles x 3 weeks prescribed hydroxyazine prn and kenalog cream at urgent care 11-03-2019   Thyroid disease     Past Surgical History:  Procedure Laterality Date   COLONOSCOPY     DILATATION & CURETTAGE/HYSTEROSCOPY WITH MYOSURE N/A 11/10/2019   Procedure: [place];  Surgeon: Servando Salina, MD;  Location: [facility];  Service: Gynecology;  Laterality: N/A;   THYROID SURGERY     didn't remove thyroid   TUBAL LIGATION      Family History  Problem Relation Age of Onset   Diabetes Mother    Hypertension Mother    Hypertension Sister    Breast cancer Sister        unsure age of onset   Breast cancer Maternal Aunt        unsure age of onset   Diabetes Maternal Grandmother    Colon cancer Neg Hx    Esophageal cancer Neg Hx    Rectal cancer Neg Hx    Stomach cancer Neg Hx    Colon polyps Neg Hx     Social History   Socioeconomic History   Marital status: Single    Spouse name: Not on file   Number of children: 3   Years of education: Not on file   Highest education level: Not on file  Occupational History   Not on file  Tobacco Use   Smoking status: Never   Smokeless tobacco: Never  Vaping Use   Vaping Use: Never used  Substance and Sexual Activity   Alcohol use: No   Drug use: No   Sexual activity: Not Currently    Birth control/protection: Surgical  Other Topics Concern   Not on file  Social History Narrative   Not on file   Social Determinants of Health   Financial Resource Strain:  Not on file  Food Insecurity: Not on file  Transportation Needs: Not on file  Physical Activity: Not on file  Stress: Not on file  Social Connections: Not on file  Intimate Partner Violence: Not on file    Review of Systems  All other systems reviewed and are negative.       Objective    BP (!) 148/83   Wt 268 lb (121.6 kg)   BMI 37.38 kg/m   Physical Exam Vitals and nursing note reviewed.  Constitutional:      General: She is not in acute distress. Cardiovascular:     Rate and Rhythm: Normal rate and regular rhythm.  Pulmonary:     Effort: Pulmonary effort is normal.     Breath sounds: Normal breath sounds.  Chest:     Comments: Abscess with some decreased purulence noted on right breast on slightly erythematous base. Minimal ttp Neurological:      General: No focal deficit present.     Mental Status: She is alert and oriented to person, place, and time.         Assessment & Plan:   Problem List Items Addressed This Visit   None Visit Diagnoses     Abscess of breast    -  Primary   Relevant Orders   WOUND CULTURE (Completed)       No follow-ups on file.   Becky Sax, MD

## 2022-09-17 ENCOUNTER — Ambulatory Visit: Payer: 59 | Admitting: Adult Health

## 2022-09-17 ENCOUNTER — Encounter: Payer: Self-pay | Admitting: Adult Health

## 2022-09-17 VITALS — BP 126/70 | HR 67 | Temp 98.6°F | Ht 71.0 in | Wt 253.0 lb

## 2022-09-17 DIAGNOSIS — G4733 Obstructive sleep apnea (adult) (pediatric): Secondary | ICD-10-CM

## 2022-09-17 DIAGNOSIS — R0683 Snoring: Secondary | ICD-10-CM

## 2022-09-17 NOTE — Assessment & Plan Note (Signed)
Moderate obstructive sleep apnea with significant symptom burden.  Sleep study results were reviewed in detail.  Will proceed with CPAP therapy.  Begin CPAP AutoSet 5 to 15 cm H2O. Encouraged on healthy weight loss  Plan  Patient Instructions  Begin CPAP At bedtime, wear all night long for at least 6hrs or more  Healthy sleep regimen  Work on healthy weight loss  Do not drive if sleepy  Follow up in 3 months and As needed

## 2022-09-17 NOTE — Patient Instructions (Signed)
Begin CPAP At bedtime, wear all night long for at least 6hrs or more  Healthy sleep regimen  Work on healthy weight loss  Do not drive if sleepy  Follow up in 3 months and As needed

## 2022-09-17 NOTE — Progress Notes (Signed)
$'@Patient'M$  ID: Amy Schultz, female    DOB: 1956/06/18, 67 y.o.   MRN: [id number]  Chief Complaint  Patient presents with   Follow-up    Referring provider: Dorna Mai, MD  HPI: 67 year old female seen for sleep consult May 15, 2022 for loud snoring and daytime sleepiness found to have moderate obstructive sleep apnea  TEST/EVENTS :  Home sleep study August 11, 2022 showed moderate sleep apnea with AHI 22.6/hour and SpO2 low at 71%  09/17/2022 Follow up: OSA  Patient returns for a follow-up visit.  Patient was seen May 15, 2022 for sleep consult for loud snoring and daytime sleepiness.  She was set up for home sleep study that was done on August 11, 2022 that showed moderate sleep apnea with AHI 22.6/hour and SpO2 low at 71%.  We reviewed her sleep study results in detail.   We went over treatment options including weight loss, oral appliance and CPAP. Patient has significant symptom burden and would like to proceed with treatment with CPAP.   Allergies  Allergen Reactions   Codeine Nausea Only   Darvocet [Propoxyphene N-Acetaminophen] Nausea Only   Demerol Nausea Only   Hydrocodone Nausea Only   Penicillins Nausea Only    Immunization History  Administered Date(s) Administered   DTP 01/13/2009   Influenza,inj,Quad PF,6+ Mos 04/27/2019   Influenza-Unspecified 03/26/2018, 06/25/2020   PFIZER(Purple Top)SARS-COV-2 Vaccination 05/16/2020, 06/09/2020   Tdap 08/27/2015    Past Medical History:  Diagnosis Date   Hypertension    Postmenopausal bleeding    Rash    both ankles x 3 weeks prescribed hydroxyazine prn and kenalog cream at urgent care 11-03-2019   Thyroid disease     Tobacco History: Social History   Tobacco Use  Smoking Status Never  Smokeless Tobacco Never   Counseling given: Not Answered   Outpatient Medications Prior to Visit  Medication Sig Dispense Refill   amLODipine (NORVASC) 10 MG tablet Take 1 tablet (10 mg total) by mouth  daily. 90 tablet 0   aspirin 81 MG chewable tablet Chew by mouth daily.     hydrochlorothiazide (HYDRODIURIL) 25 MG tablet Take 1 tablet (25 mg total) by mouth daily. 90 tablet 0   losartan (COZAAR) 100 MG tablet Take 1 tablet (100 mg total) by mouth daily. 90 tablet 0   triamcinolone cream (KENALOG) 0.1 % Apply 1 application topically 2 (two) times daily. 45 g 0   COVID-19 At Home Antigen Test ([facility] COVID-19 HOME TEST) KIT Use as directed (Patient not taking: Reported on 05/15/2022) 4 each 0   doxycycline (VIBRA-TABS) 100 MG tablet Take 1 tablet (100 mg total) by mouth 2 (two) times daily. (Patient not taking: Reported on 09/17/2022) 14 tablet 0   phentermine (ADIPEX-P) 37.5 MG tablet Take 1 tablet (37.5 mg total) by mouth daily before breakfast. (Patient not taking: Reported on 05/15/2022) 30 tablet 0   Vitamin D, Ergocalciferol, (DRISDOL) 1.25 MG (50000 UNIT) CAPS capsule Take 1 capsule (50,000 Units total) by mouth every 7 (seven) days. (Patient not taking: Reported on 09/13/2022) 12 capsule 0   No facility-administered medications prior to visit.     Review of Systems:   Constitutional:   No  weight loss, night sweats,  Fevers, chills, + fatigue, or  lassitude.  HEENT:   No headaches,  Difficulty swallowing,  Tooth/dental problems, or  Sore throat,                No sneezing, itching, ear ache, nasal congestion, post nasal  drip,   CV:  No chest pain,  Orthopnea, PND, swelling in lower extremities, anasarca, dizziness, palpitations, syncope.   GI  No heartburn, indigestion, abdominal pain, nausea, vomiting, diarrhea, change in bowel habits, loss of appetite, bloody stools.   Resp: No shortness of breath with exertion or at rest.  No excess mucus, no productive cough,  No non-productive cough,  No coughing up of blood.  No change in color of mucus.  No wheezing.  No chest wall deformity  Skin: no rash or lesions.  GU: no dysuria, change in color of urine, no urgency or frequency.   No flank pain, no hematuria   MS:  No joint pain or swelling.  No decreased range of motion.  No back pain.    Physical Exam  BP 126/70   Pulse 67   Temp 98.6 F (37 C) (Oral)   Ht '5\' 11"'$  (1.803 m)   Wt 253 lb (114.8 kg)   SpO2 98%   BMI 35.29 kg/m   GEN: A/Ox3; pleasant , NAD, well nourished    HEENT:  [place]/AT,  NOSE-clear, THROAT-clear, no lesions, no postnasal drip or exudate noted.  Class 3 MP airway   NECK:  Supple w/ fair ROM; no JVD; normal carotid impulses w/o bruits; no thyromegaly or nodules palpated; no lymphadenopathy.    RESP  Clear  P & A; w/o, wheezes/ rales/ or rhonchi. no accessory muscle use, no dullness to percussion  CARD:  RRR, no m/r/g, no peripheral edema, pulses intact, no cyanosis or clubbing.  GI:   Soft & nt; nml bowel sounds; no organomegaly or masses detected.   Musco: Warm bil, no deformities or joint swelling noted.   Neuro: alert, no focal deficits noted.    Skin: Warm, no lesions or rashes    Lab Results:  CBC    Component Value Date/Time   WBC 8.4 04/02/2022 1114   WBC 6.9 11/10/2019 1247   RBC 4.58 04/02/2022 1114   RBC 4.80 11/10/2019 1247   HGB 12.0 04/02/2022 1114   HCT 36.6 04/02/2022 1114   PLT 307 04/02/2022 1114   MCV 80 04/02/2022 1114   MCH 26.2 (L) 04/02/2022 1114   MCH 25.8 (L) 11/10/2019 1247   MCHC 32.8 04/02/2022 1114   MCHC 31.6 11/10/2019 1247   RDW 14.7 04/02/2022 1114   LYMPHSABS 2.6 04/02/2022 1114   MONOABS 0.4 10/15/2010 1349   EOSABS 0.2 04/02/2022 1114   BASOSABS 0.1 04/02/2022 1114    BMET    Component Value Date/Time   NA 139 04/02/2022 1114   K 4.2 04/02/2022 1114   CL 101 04/02/2022 1114   CO2 25 04/02/2022 1114   GLUCOSE 117 (H) 04/02/2022 1114   GLUCOSE 96 11/10/2019 1247   BUN 11 04/02/2022 1114   CREATININE 0.89 04/02/2022 1114   CALCIUM 9.3 04/02/2022 1114   GFRNONAA 80 09/19/2020 0940   GFRAA 93 09/19/2020 0940    BNP No results found for: "BNP"  ProBNP No results found  for: "PROBNP"  Imaging: No results found.        No data to display          No results found for: "NITRICOXIDE"      Assessment & Plan:   OSA (obstructive sleep apnea) Moderate obstructive sleep apnea with significant symptom burden.  Sleep study results were reviewed in detail.  Will proceed with CPAP therapy.  Begin CPAP AutoSet 5 to 15 cm H2O. Encouraged on healthy weight loss  Plan  Patient Instructions  Begin CPAP At bedtime, wear all night long for at least 6hrs or more  Healthy sleep regimen  Work on healthy weight loss  Do not drive if sleepy  Follow up in 3 months and As needed        Rexene Edison, NP 09/17/2022

## 2022-09-20 ENCOUNTER — Encounter: Payer: Self-pay | Admitting: *Deleted

## 2022-09-20 ENCOUNTER — Encounter: Payer: Self-pay | Admitting: Family Medicine

## 2022-09-20 ENCOUNTER — Ambulatory Visit (INDEPENDENT_AMBULATORY_CARE_PROVIDER_SITE_OTHER): Payer: 59 | Admitting: Family Medicine

## 2022-09-20 ENCOUNTER — Other Ambulatory Visit: Payer: Self-pay | Admitting: *Deleted

## 2022-09-20 ENCOUNTER — Other Ambulatory Visit (HOSPITAL_COMMUNITY): Payer: Self-pay

## 2022-09-20 ENCOUNTER — Telehealth: Payer: Self-pay | Admitting: Family Medicine

## 2022-09-20 VITALS — BP 143/83 | HR 70 | Temp 98.1°F | Resp 16 | Wt 253.0 lb

## 2022-09-20 DIAGNOSIS — N611 Abscess of the breast and nipple: Secondary | ICD-10-CM | POA: Diagnosis not present

## 2022-09-20 MED ORDER — DOXYCYCLINE HYCLATE 100 MG PO TABS
100.0000 mg | ORAL_TABLET | Freq: Two times a day (BID) | ORAL | 0 refills | Status: DC
  Filled 2022-09-20 – 2022-09-23 (×3): qty 14, 7d supply, fill #0

## 2022-09-20 NOTE — Telephone Encounter (Signed)
No medication was order at this visit, per provider. Patient should have had enough medication from previous visit left.  Copied from [place] [phone]. Topic: General - Inquiry >> Sep 20, 2022  9:50 AM Amy Schultz wrote: Reason for CRM: Pt stated she saw PCP today and that Dr.Wilson was sending an antibiotic, but it was sent to the wrong pharmacy. Please make sure it is sent to the pharmacy below. Pt is unsure of medication name.   [facility] [address] [address] Phone: [phone] Fax: [phone] Hours: M-F 7:30am-6pm

## 2022-09-20 NOTE — Progress Notes (Unsigned)
Patient is here for recheck of breast abscess. Patient said it's a little better.

## 2022-09-20 NOTE — Telephone Encounter (Signed)
Doroteo Bradford, [facility] called and notified the encounter is being routed to the office.

## 2022-09-20 NOTE — Telephone Encounter (Signed)
Patient medication has been resent to pharmacy. Patient aware

## 2022-09-20 NOTE — Telephone Encounter (Signed)
Medication Refill - Medication: doxycycline (VIBRA-TABS) 100 MG tablet   Pt called back and stated she never picked up her antibiotic from her last visit on 09/16/22 because it was sent to the wrong pharmacy. Pt is requesting medication to be transferred to the pharmacy below.  Has the patient contacted their pharmacy? Yes.    (Agent: If yes, when and what did the pharmacy advise?)  Preferred Pharmacy (with phone number or street name):  [facility]  [address] [place] [address]  Phone: [phone] Fax: [phone]  Hours: M-F 7:30am-6pm   Has the patient been seen for an appointment in the last year OR does the patient have an upcoming appointment? Yes.    Agent: Please be advised that RX refills may take up to 3 business days. We ask that you follow-up with your pharmacy.

## 2022-09-23 ENCOUNTER — Other Ambulatory Visit: Payer: Self-pay

## 2022-09-23 ENCOUNTER — Other Ambulatory Visit (HOSPITAL_COMMUNITY): Payer: Self-pay

## 2022-09-23 ENCOUNTER — Encounter: Payer: Self-pay | Admitting: Family Medicine

## 2022-09-23 NOTE — Progress Notes (Signed)
Established Patient Office Visit  Subjective    Patient ID: IZELLA YBANEZ, female    DOB: May 04, 1956  Age: 67 y.o. MRN: [id number]  CC:  Chief Complaint  Patient presents with   Follow-up    Breast abscess    HPI Amy Schultz presents for follow up of breast abscess. She reports that it is improving.    Outpatient Encounter Medications as of 09/20/2022  Medication Sig   amLODipine (NORVASC) 10 MG tablet Take 1 tablet (10 mg total) by mouth daily.   aspirin 81 MG chewable tablet Chew by mouth daily.   hydrochlorothiazide (HYDRODIURIL) 25 MG tablet Take 1 tablet (25 mg total) by mouth daily.   losartan (COZAAR) 100 MG tablet Take 1 tablet (100 mg total) by mouth daily.   triamcinolone cream (KENALOG) 0.1 % Apply 1 application topically 2 (two) times daily.   [DISCONTINUED] COVID-19 At Home Antigen Test [facility] COVID-19 HOME TEST) KIT Use as directed (Patient not taking: Reported on 05/15/2022)   [DISCONTINUED] doxycycline (VIBRA-TABS) 100 MG tablet Take 1 tablet (100 mg total) by mouth 2 (two) times daily. (Patient not taking: Reported on 09/17/2022)   [DISCONTINUED] phentermine (ADIPEX-P) 37.5 MG tablet Take 1 tablet (37.5 mg total) by mouth daily before breakfast. (Patient not taking: Reported on 05/15/2022)   [DISCONTINUED] Vitamin D, Ergocalciferol, (DRISDOL) 1.25 MG (50000 UNIT) CAPS capsule Take 1 capsule (50,000 Units total) by mouth every 7 (seven) days. (Patient not taking: Reported on 09/13/2022)   No facility-administered encounter medications on file as of 09/20/2022.    Past Medical History:  Diagnosis Date   Hypertension    Postmenopausal bleeding    Rash    both ankles x 3 weeks prescribed hydroxyazine prn and kenalog cream at urgent care 11-03-2019   Thyroid disease     Past Surgical History:  Procedure Laterality Date   COLONOSCOPY     DILATATION & CURETTAGE/HYSTEROSCOPY WITH MYOSURE N/A 11/10/2019   Procedure: [place];   Surgeon: Servando Salina, MD;  Location: [facility];  Service: Gynecology;  Laterality: N/A;   THYROID SURGERY     didn't remove thyroid   TUBAL LIGATION      Family History  Problem Relation Age of Onset   Diabetes Mother    Hypertension Mother    Hypertension Sister    Breast cancer Sister        unsure age of onset   Breast cancer Maternal Aunt        unsure age of onset   Diabetes Maternal Grandmother    Colon cancer Neg Hx    Esophageal cancer Neg Hx    Rectal cancer Neg Hx    Stomach cancer Neg Hx    Colon polyps Neg Hx     Social History   Socioeconomic History   Marital status: Single    Spouse name: Not on file   Number of children: 3   Years of education: Not on file   Highest education level: Not on file  Occupational History   Not on file  Tobacco Use   Smoking status: Never   Smokeless tobacco: Never  Vaping Use   Vaping Use: Never used  Substance and Sexual Activity   Alcohol use: No   Drug use: No   Sexual activity: Not Currently    Birth control/protection: Surgical  Other Topics Concern   Not on file  Social History Narrative   Not on file   Social Determinants of  Health   Financial Resource Strain: Not on file  Food Insecurity: Not on file  Transportation Needs: Not on file  Physical Activity: Not on file  Stress: Not on file  Social Connections: Not on file  Intimate Partner Violence: Not on file    Review of Systems  All other systems reviewed and are negative.       Objective    BP (!) 143/83   Pulse 70   Temp 98.1 F (36.7 C) (Oral)   Resp 16   Wt 253 lb (114.8 kg)   SpO2 96%   BMI 35.29 kg/m   Physical Exam Vitals and nursing note reviewed.  Constitutional:      General: She is not in acute distress. Cardiovascular:     Rate and Rhythm: Normal rate and regular rhythm.  Pulmonary:     Effort: Pulmonary effort is normal.     Breath sounds: Normal breath sounds.  Chest:     Comments:  Abscess with some decreased purulence noted on right breast on slightly erythematous base. No TTP Neurological:     General: No focal deficit present.     Mental Status: She is alert and oriented to person, place, and time.         Assessment & Plan:   1. Abscess of breast Improving. Will extend abx therapy additional 7 days.     Return if symptoms worsen or fail to improve.   Becky Sax, MD

## 2022-09-30 ENCOUNTER — Telehealth: Payer: Self-pay | Admitting: Urology

## 2022-09-30 NOTE — Telephone Encounter (Signed)
OFFICE DOS - 10/09/22   [employer] EFFECTIVE DATE - 08/26/22   HAMMERTOE REPAIR 5TH LEFT --- 49826    PLAN DEDUCTIBLE - $100.00 W/ $0.00 REMAINING OUT OF POCKET - $2,500.00 W/ $2,300 Remaining COINSURANCE - 0% COPAY - $0.00    PER [employer] AUTOMATIVE SYSTEM FOR CPT CODE 41583 NO PRIOR AUTH IS REQUIRED.  REF # [id number]

## 2022-10-03 ENCOUNTER — Ambulatory Visit: Payer: 59

## 2022-10-03 ENCOUNTER — Other Ambulatory Visit: Payer: Self-pay | Admitting: Family Medicine

## 2022-10-03 ENCOUNTER — Ambulatory Visit: Payer: Self-pay

## 2022-10-03 DIAGNOSIS — N644 Mastodynia: Secondary | ICD-10-CM

## 2022-10-03 DIAGNOSIS — M79676 Pain in unspecified toe(s): Secondary | ICD-10-CM

## 2022-10-03 DIAGNOSIS — N611 Abscess of the breast and nipple: Secondary | ICD-10-CM

## 2022-10-03 NOTE — Telephone Encounter (Signed)
Message from [place] sent at 10/03/2022 10:01 AM EST  Summary: pt took meds but not working   Pt has called the community line and wanted to speak directly to Dr Redmond Pulling. Pt did not want to relay message. She finally stated that the boil was not any better and took the medication and did not work. Pt wants a fu call but states wants to talk to Dr Redmond Pulling. doxycycline (VIBRA-TABS) 100 MG tablet 14 tablet 0 09/20/2022 Sig - Route: Take 1 tablet (100 mg total) by mouth 2 (two) times daily. - Oral         Pt refused triage. Asked pt having fever and refused to answer. Teams messaged Cassandra and will route to office.

## 2022-10-03 NOTE — Telephone Encounter (Addendum)
I have attempted without success to contact this patient by phone to I left a message on answering machine.   Patient has been schedule for breast imaging 10/21/2022

## 2022-10-03 NOTE — Telephone Encounter (Signed)
Duplicate message. 

## 2022-10-03 NOTE — Telephone Encounter (Signed)
Duplicate chart

## 2022-10-07 DIAGNOSIS — G4733 Obstructive sleep apnea (adult) (pediatric): Secondary | ICD-10-CM | POA: Diagnosis not present

## 2022-10-09 ENCOUNTER — Ambulatory Visit: Payer: 59 | Admitting: Podiatry

## 2022-10-09 ENCOUNTER — Other Ambulatory Visit (HOSPITAL_COMMUNITY): Payer: Self-pay

## 2022-10-09 ENCOUNTER — Other Ambulatory Visit: Payer: Self-pay | Admitting: Family Medicine

## 2022-10-09 DIAGNOSIS — M2042 Other hammer toe(s) (acquired), left foot: Secondary | ICD-10-CM

## 2022-10-09 MED ORDER — ONDANSETRON HCL 4 MG PO TABS
4.0000 mg | ORAL_TABLET | Freq: Three times a day (TID) | ORAL | 0 refills | Status: DC | PRN
  Filled 2022-10-09: qty 20, 7d supply, fill #0

## 2022-10-09 MED ORDER — LOSARTAN POTASSIUM 100 MG PO TABS
100.0000 mg | ORAL_TABLET | Freq: Every day | ORAL | 0 refills | Status: DC
  Filled 2022-10-09: qty 90, 90d supply, fill #0

## 2022-10-09 MED ORDER — HYDROCHLOROTHIAZIDE 25 MG PO TABS
25.0000 mg | ORAL_TABLET | Freq: Every day | ORAL | 0 refills | Status: DC
  Filled 2022-10-09: qty 90, 90d supply, fill #0

## 2022-10-09 MED ORDER — HYDROCODONE-ACETAMINOPHEN 5-325 MG PO TABS
1.0000 | ORAL_TABLET | ORAL | 0 refills | Status: DC | PRN
  Filled 2022-10-09: qty 30, 5d supply, fill #0

## 2022-10-09 NOTE — Progress Notes (Signed)
   OPERATIVE REPORT Patient name: Amy Schultz MRN: [id number] DOB: 08/07/1956  DOS:  10/09/22  Preop Dx: Symptomatic hammertoe fifth digit left Postop Dx: same  Procedure:  1. PIPJ arthroplasty fifth digit left  Surgeon: Edrick Kins DPM  Anesthesia: 2% lidocaine plain totaling 3 mL infiltrated in a digital block fashion  Hemostasis: None  EBL: 10 mL Materials: None Injectables: None Pathology: none  Condition: The patient tolerated the procedure and anesthesia well. No complications noted or reported   Justification for procedure: The patient is a 67 y.o. female who presents today for correction of a chronically symptomatic hammertoe with overlying corn to the left fifth digit. Conservative modalities of been unsuccessful in providing any sort of satisfactory alleviation of symptoms with the patient. The patient was told benefits as well as possible side effects of the surgery. The patient consented for surgical correction. The patient consent form was reviewed. All patient questions were answered. No guarantees were expressed or implied.   Procedure in Detail: The patient was brought to the procedure room, placed in the procedure chair in the supine position at which time an aseptic scrub and drape were performed about the patient's respective lower extremity after anesthesia was induced as described above. Attention was then directed to the surgical area where procedure number one commenced.  Procedure #1: PIPJ arthroplasty fifth digit left 1.5 cm elliptical incision was planned and made overlying the symptomatic corn and PIPJ of the fifth digit left foot.  Incision was carried directly down to the level of bone and joint capsule using a #15 scalpel.a this time capsulotomy was performed as well as transverse tenotomy of the extensor tendon.  This allowed exposure of the underlying head of the proximal phalanx.  Additional careful soft tissue dissection was utilized to reflect  away the joint capsule and better expose the head of the proximal phalanx.  Bone-cutting forceps were utilized to create an osteotomy at the phalangeal neck of the proximal phalanx.  The head was removed in toto.  Copious irrigation was utilized in preparation for primary closure.  4-0 Vicryl suture was utilized to reapproximate the opposing ends of the EDL tendon followed by 3-0 nylon suture to reapproximate superficial skin edges.  Dry sterile dressings were then applied about the patient's lower extremity. The patient was then discharged from the office with adequate prescriptions for analgesia. Verbal as well as written instructions were provided for the patient regarding postprocedural care. The patient is to keep the dressings clean dry and intact until they are to follow up in the office upon discharge in one week.  Weightbearing as tolerated in a postsurgical shoe  Edrick Kins, DPM [facility]  Dr. Edrick Kins, DPM    [address], [place] [address]                Office [phone]  Fax [phone]

## 2022-10-14 ENCOUNTER — Ambulatory Visit (INDEPENDENT_AMBULATORY_CARE_PROVIDER_SITE_OTHER): Payer: 59 | Admitting: Podiatry

## 2022-10-14 ENCOUNTER — Other Ambulatory Visit: Payer: 59

## 2022-10-14 ENCOUNTER — Ambulatory Visit (INDEPENDENT_AMBULATORY_CARE_PROVIDER_SITE_OTHER): Payer: 59

## 2022-10-14 DIAGNOSIS — Z9889 Other specified postprocedural states: Secondary | ICD-10-CM

## 2022-10-14 DIAGNOSIS — M2042 Other hammer toe(s) (acquired), left foot: Secondary | ICD-10-CM

## 2022-10-14 NOTE — Progress Notes (Signed)
   No chief complaint on file.   Subjective:  Patient presents today status post arthroplasty fifth digit left foot performed in office.  Patient doing well.  1 week postop.  WBAT in surgical shoe without complaints or complication.  She states she only has intermittent occasional pain to the toe.  Past Medical History:  Diagnosis Date   Hypertension    Postmenopausal bleeding    Rash    both ankles x 3 weeks prescribed hydroxyazine prn and kenalog cream at urgent care 11-03-2019   Thyroid disease     Past Surgical History:  Procedure Laterality Date   COLONOSCOPY     DILATATION & CURETTAGE/HYSTEROSCOPY WITH MYOSURE N/A 11/10/2019   Procedure: [place];  Surgeon: Servando Salina, MD;  Location: [facility];  Service: Gynecology;  Laterality: N/A;   THYROID SURGERY     didn't remove thyroid   TUBAL LIGATION      Allergies  Allergen Reactions   Codeine Nausea Only   Darvocet [Propoxyphene N-Acetaminophen] Nausea Only   Demerol Nausea Only   Hydrocodone Nausea Only   Penicillins Nausea Only    Objective/Physical Exam Neurovascular status intact.  Incision well coapted with sutures intact. No sign of infectious process noted. No dehiscence. No active bleeding noted.  Moderate edema noted to the surgical extremity.  Radiographic Exam LT foot 10/14/2022:  Arthroplasty of the head of the proximal phalanx left fifth digit noted.  Clean osteotomy.  No gas within the tissues.  Overall radiographically well-healing surgical site  Assessment: 1. s/p arthroplasty fifth digit left. DOS: 10/09/2022 in office   Plan of Care:  1. Patient was evaluated. X-rays reviewed 2.  Patient may begin washing and showering and getting the foot wet.  Recommend triple antibiotic and a Band-Aid daily 3.  Continue WBAT surgical shoe 4.  Return to clinic 1 week suture removal   Edrick Kins, DPM [facility]  Dr. Edrick Kins, DPM    [address], [place] [address]                Office [phone]  Fax [phone]

## 2022-10-21 ENCOUNTER — Encounter: Payer: Self-pay | Admitting: Podiatry

## 2022-10-21 ENCOUNTER — Ambulatory Visit
Admission: RE | Admit: 2022-10-21 | Discharge: 2022-10-21 | Disposition: A | Discharge status: Home / Self Care | Payer: 59 | Source: Ambulatory Visit | Attending: Family Medicine | Admitting: Family Medicine

## 2022-10-21 ENCOUNTER — Ambulatory Visit (INDEPENDENT_AMBULATORY_CARE_PROVIDER_SITE_OTHER): Payer: 59 | Admitting: Podiatry

## 2022-10-21 ENCOUNTER — Ambulatory Visit: Payer: 59

## 2022-10-21 ENCOUNTER — Other Ambulatory Visit (HOSPITAL_COMMUNITY): Payer: Self-pay

## 2022-10-21 VITALS — BP 179/93 | HR 67

## 2022-10-21 DIAGNOSIS — R928 Other abnormal and inconclusive findings on diagnostic imaging of breast: Secondary | ICD-10-CM | POA: Diagnosis not present

## 2022-10-21 DIAGNOSIS — M2042 Other hammer toe(s) (acquired), left foot: Secondary | ICD-10-CM

## 2022-10-21 DIAGNOSIS — N6312 Unspecified lump in the right breast, upper inner quadrant: Secondary | ICD-10-CM | POA: Diagnosis not present

## 2022-10-21 DIAGNOSIS — N611 Abscess of the breast and nipple: Secondary | ICD-10-CM

## 2022-10-21 DIAGNOSIS — N644 Mastodynia: Secondary | ICD-10-CM

## 2022-10-21 DIAGNOSIS — Z9889 Other specified postprocedural states: Secondary | ICD-10-CM

## 2022-10-21 NOTE — Progress Notes (Signed)
   Chief Complaint  Patient presents with   Routine Post Op    POV #2 DOS 10/09/22 --- HAMMERTOE ARTHROPLASTY 5TH DIGIT LEFT   "Its throbbing"    Subjective:  Patient presents today status post arthroplasty fifth digit left foot performed in office.  Patient continues to do well.  She is weightbearing in the surgical shoe as instructed.  No new complaints at this time  Past Medical History:  Diagnosis Date   Hypertension    Postmenopausal bleeding    Rash    both ankles x 3 weeks prescribed hydroxyazine prn and kenalog cream at urgent care 11-03-2019   Thyroid disease     Past Surgical History:  Procedure Laterality Date   COLONOSCOPY     DILATATION & CURETTAGE/HYSTEROSCOPY WITH MYOSURE N/A 11/10/2019   Procedure: [place];  Surgeon: Servando Salina, MD;  Location: [place];  Service: Gynecology;  Laterality: N/A;   THYROID SURGERY     didn't remove thyroid   TUBAL LIGATION      Allergies  Allergen Reactions   Codeine Nausea Only   Darvocet [Propoxyphene N-Acetaminophen] Nausea Only   Demerol Nausea Only   Hydrocodone Nausea Only   Penicillins Nausea Only    Objective/Physical Exam Neurovascular status intact.  Incision well coapted with sutures intact. No sign of infectious process noted. No dehiscence. No active bleeding noted.  Minimal edema noted to the surgical toe  Radiographic Exam LT foot 10/14/2022:  Arthroplasty of the head of the proximal phalanx left fifth digit noted.  Clean osteotomy.  No gas within the tissues.  Overall radiographically well-healing surgical site  Assessment: 1. s/p arthroplasty fifth digit left. DOS: 10/09/2022 in office   Plan of Care:  1. Patient was evaluated.  2.  Sutures removed 3.  Patient may transition out of the postsurgical shoe into regular tennis shoes 4.  Return to clinic 4 weeks for final follow-up  Edrick Kins, DPM [facility]  Dr. Edrick Kins, DPM    [address], [place] [address]                Office [phone]  Fax [phone]

## 2022-10-23 ENCOUNTER — Other Ambulatory Visit: Payer: Self-pay | Admitting: Podiatry

## 2022-10-23 DIAGNOSIS — Z9889 Other specified postprocedural states: Secondary | ICD-10-CM

## 2022-10-23 DIAGNOSIS — M2042 Other hammer toe(s) (acquired), left foot: Secondary | ICD-10-CM

## 2022-11-01 ENCOUNTER — Encounter: Payer: Self-pay | Admitting: *Deleted

## 2022-11-01 ENCOUNTER — Telehealth: Payer: Self-pay | Admitting: *Deleted

## 2022-11-01 NOTE — Telephone Encounter (Signed)
Okay to provide out of work note for those days.  Will see her next scheduled appointment.  Thanks, Dr. Amalia Hailey

## 2022-11-01 NOTE — Telephone Encounter (Signed)
Note has been completed and patient's daughter has picked up per patient's request-11/01/22

## 2022-11-01 NOTE — Telephone Encounter (Signed)
Foot is still throbbing and sometimes unable to walk on it,taking Excedrin,naproxen(OTC,)unable to take Norco-/Vicodin prescribed. She/ was supposed to go back to work tonight but unable because of foot pain and not being able to stand for a 12 hour shift, would like an out of work note for days,(11/01/22-11/05/22), will have daughter([employer]/gallery curator) pick up when ready.

## 2022-11-04 ENCOUNTER — Encounter: Payer: 59 | Admitting: Podiatry

## 2022-11-05 DIAGNOSIS — G4733 Obstructive sleep apnea (adult) (pediatric): Secondary | ICD-10-CM | POA: Diagnosis not present

## 2022-11-06 ENCOUNTER — Encounter: Payer: Self-pay | Admitting: Podiatry

## 2022-11-06 ENCOUNTER — Ambulatory Visit (INDEPENDENT_AMBULATORY_CARE_PROVIDER_SITE_OTHER): Payer: 59

## 2022-11-06 ENCOUNTER — Ambulatory Visit (INDEPENDENT_AMBULATORY_CARE_PROVIDER_SITE_OTHER): Payer: 59 | Admitting: Podiatry

## 2022-11-06 DIAGNOSIS — Z9889 Other specified postprocedural states: Secondary | ICD-10-CM

## 2022-11-06 NOTE — Progress Notes (Signed)
   No chief complaint on file.   Subjective:  Patient presents today status post arthroplasty fifth digit left foot performed in office on 10/09/2022.  Patient has some tenderness to the toe.  She presents today wearing a postsurgical shoe. No new complaints.   Past Medical History:  Diagnosis Date   Hypertension    Postmenopausal bleeding    Rash    both ankles x 3 weeks prescribed hydroxyazine prn and kenalog cream at urgent care 11-03-2019   Thyroid disease     Past Surgical History:  Procedure Laterality Date   COLONOSCOPY     DILATATION & CURETTAGE/HYSTEROSCOPY WITH MYOSURE N/A 11/10/2019   Procedure: [place];  Surgeon: Servando Salina, MD;  Location: [place];  Service: Gynecology;  Laterality: N/A;   THYROID SURGERY     didn't remove thyroid   TUBAL LIGATION      Allergies  Allergen Reactions   Codeine Nausea Only   Darvocet [Propoxyphene N-Acetaminophen] Nausea Only   Demerol Nausea Only   Hydrocodone Nausea Only   Penicillins Nausea Only    Objective/Physical Exam Neurovascular status intact.  Skin incision nicely healed.  There continues to be some sensitivity to the area but there is no edema.  Radiographic Exam LT foot 11/06/2022:  Arthroplasty of the head of the proximal phalanx left fifth digit noted.  Clean osteotomy.  Well-healing surgical toe radiographically  Assessment: 1. s/p arthroplasty fifth digit left. DOS: 10/09/2022 in office   Plan of Care:  1. Patient was evaluated.  2.  Patient continues to have some slight tenderness to the area.  Recommend daily massage with lotion 3.  Slowly increase to full activity no restrictions of the next 2 weeks. 4.  Patient may return to work full activity no restrictions beginning 11/20/2022.  Note provided. 5.  Return to clinic as needed  Edrick Kins, DPM [facility]  Dr. Edrick Kins, DPM    [address], [place] [address]                Office [phone]  Fax [phone]

## 2022-11-18 ENCOUNTER — Other Ambulatory Visit: Payer: Self-pay

## 2022-11-18 ENCOUNTER — Other Ambulatory Visit (HOSPITAL_COMMUNITY): Payer: Self-pay

## 2022-11-18 ENCOUNTER — Emergency Department (HOSPITAL_COMMUNITY): Payer: 59

## 2022-11-18 ENCOUNTER — Encounter: Payer: Self-pay | Admitting: Podiatry

## 2022-11-18 ENCOUNTER — Emergency Department (HOSPITAL_COMMUNITY)
Admission: EM | Admit: 2022-11-18 | Discharge: 2022-11-19 | Disposition: A | Discharge status: Home / Self Care | Payer: 59 | Attending: Emergency Medicine | Admitting: Emergency Medicine

## 2022-11-18 ENCOUNTER — Ambulatory Visit: Payer: 59

## 2022-11-18 ENCOUNTER — Ambulatory Visit (INDEPENDENT_AMBULATORY_CARE_PROVIDER_SITE_OTHER): Payer: 59 | Admitting: Podiatry

## 2022-11-18 DIAGNOSIS — R519 Headache, unspecified: Secondary | ICD-10-CM | POA: Diagnosis not present

## 2022-11-18 DIAGNOSIS — R202 Paresthesia of skin: Secondary | ICD-10-CM

## 2022-11-18 DIAGNOSIS — M2042 Other hammer toe(s) (acquired), left foot: Secondary | ICD-10-CM

## 2022-11-18 DIAGNOSIS — Z7982 Long term (current) use of aspirin: Secondary | ICD-10-CM | POA: Diagnosis not present

## 2022-11-18 DIAGNOSIS — Z9889 Other specified postprocedural states: Secondary | ICD-10-CM

## 2022-11-18 DIAGNOSIS — I10 Essential (primary) hypertension: Secondary | ICD-10-CM | POA: Insufficient documentation

## 2022-11-18 DIAGNOSIS — E039 Hypothyroidism, unspecified: Secondary | ICD-10-CM | POA: Insufficient documentation

## 2022-11-18 DIAGNOSIS — Z79899 Other long term (current) drug therapy: Secondary | ICD-10-CM | POA: Insufficient documentation

## 2022-11-18 LAB — CBC
HCT: 37.8 % (ref 36.0–46.0)
Hemoglobin: 12.2 g/dL (ref 12.0–15.0)
MCH: 26.1 pg (ref 26.0–34.0)
MCHC: 32.3 g/dL (ref 30.0–36.0)
MCV: 80.8 fL (ref 80.0–100.0)
Platelets: 294 10*3/uL (ref 150–400)
RBC: 4.68 MIL/uL (ref 3.87–5.11)
RDW: 15.5 % (ref 11.5–15.5)
WBC: 8.5 10*3/uL (ref 4.0–10.5)
nRBC: 0 % (ref 0.0–0.2)

## 2022-11-18 LAB — BASIC METABOLIC PANEL
Anion gap: 10 (ref 5–15)
BUN: 16 mg/dL (ref 8–23)
CO2: 26 mmol/L (ref 22–32)
Calcium: 9.1 mg/dL (ref 8.9–10.3)
Chloride: 98 mmol/L (ref 98–111)
Creatinine, Ser: 0.78 mg/dL (ref 0.44–1.00)
GFR, Estimated: >60 mL/min (ref >60)
Glucose, Bld: 112 mg/dL — ABNORMAL HIGH (ref 70–99)
Potassium: 3.9 mmol/L (ref 3.5–5.1)
Sodium: 134 mmol/L — ABNORMAL LOW (ref 135–145)

## 2022-11-18 MED ORDER — NAPROXEN 500 MG PO TABS
500.0000 mg | ORAL_TABLET | Freq: Two times a day (BID) | ORAL | 1 refills | Status: DC
  Filled 2022-11-18: qty 60, 30d supply, fill #0

## 2022-11-18 NOTE — ED Provider Triage Note (Signed)
Emergency Medicine Provider Triage Evaluation Note  Amy Schultz , a 67 y.o. female  was evaluated in triage.  Pt complains of headache. States that same came on gradually 4 days ago and has been intermittent since without discernable triggers. She states that it feels more like a tingling sensation throughout her whole head. She has never felt this before.  Denies vision changes, photophobia, phonophobia, nausea, or vomiting.  Endorses compliance with her home blood pressure regimen and states that she used to check her blood pressure every day but has not in over a month. She denies chest pain or shortness of breath.  Review of Systems  Positive:  Negative:   Physical Exam  BP (!) 218/83   Pulse 66   Temp 98 F (36.7 C) (Oral)   Resp 17   SpO2 99%  Gen:   Awake, no distress   Resp:  Normal effort  MSK:   Moves extremities without difficulty  Other:    Medical Decision Making  Medically screening exam initiated at 10:44 PM.  Appropriate orders placed.  Amy Schultz was informed that the remainder of the evaluation will be completed by another provider, this initial triage assessment does not replace that evaluation, and the importance of remaining in the ED until their evaluation is complete.     Bud Face, PA-C 11/18/22 2247

## 2022-11-18 NOTE — ED Triage Notes (Signed)
Pt presents for on and off headache x 4 days. Does improve with excedrin but then returns. No vision changes. No sensitivity to light or sound. No hx of migraines. Does have hypertension but states it is well controlled.

## 2022-11-18 NOTE — Progress Notes (Signed)
   Chief Complaint  Patient presents with   Routine Post Op    POV # 4DOS 10/09/22 --- HAMMERTOE ARTHROPLASTY 5TH DIGIT LEFT   patient is still having pain, states she is unable to wear a shoe, NO N/V/F/C/SOB     Subjective:  Patient presents today status post arthroplasty fifth digit left foot performed in office on 10/09/2022.  She presents today wearing a postsurgical shoe.  Patient continues to have some pain and tenderness associated to the toe.  She says that she has tried to wear tennis shoes but it irritates the toe too much.  Past Medical History:  Diagnosis Date   Hypertension    Postmenopausal bleeding    Rash    both ankles x 3 weeks prescribed hydroxyazine prn and kenalog cream at urgent care 11-03-2019   Thyroid disease     Past Surgical History:  Procedure Laterality Date   COLONOSCOPY     DILATATION & CURETTAGE/HYSTEROSCOPY WITH MYOSURE N/A 11/10/2019   Procedure: [place];  Surgeon: Servando Salina, MD;  Location: [facility];  Service: Gynecology;  Laterality: N/A;   THYROID SURGERY     didn't remove thyroid   TUBAL LIGATION      Allergies  Allergen Reactions   Codeine Nausea Only   Darvocet [Propoxyphene N-Acetaminophen] Nausea Only   Demerol Nausea Only   Hydrocodone Nausea Only   Penicillins Nausea Only    Objective/Physical Exam Neurovascular status intact.  Skin incision nicely healed.  There continues to be some sensitivity to the area but there is no edema.  Toes in good rectus alignment  Radiographic Exam LT foot 11/18/2022:  No significant change since prior x-rays.  Arthroplasty of the head of the proximal phalanx left fifth digit noted.  Clean osteotomy.  Well-healing surgical toe radiographically  Assessment: 1. s/p arthroplasty fifth digit left. DOS: 10/09/2022 in office   Plan of Care:  1. Patient was evaluated.  2.  Unfortunately the patient continues to have some tenderness to the  toe. 3.  Recommend OTC diclofenac topical massaged into the toe area 2-3 times daily 4.  Recommend wide fitting shoes that do not constrict the toebox area 5.  Prescription for naproxen 500 mg 2 times daily  6.  Unfortunately the patient continues to have this pain and unable to return to work.  Note for work provided to refrain from work for an additional 3 weeks 7.  Return to clinic 3 weeks  Edrick Kins, DPM [facility]  Dr. Edrick Kins, DPM    [address], [place] [address]                Office [phone]  Fax [phone]

## 2022-11-19 NOTE — ED Provider Notes (Signed)
[name] Note  CSN: [id number] Arrival date & time: 11/18/22 2212  Chief Complaint(s) Headache  HPI Amy Schultz is a 67 y.o. female here for tingling/throbbing head discomfort. Mild, but annoying.  They are brief lasting a few seconds at a time and self resolving.  Occur approximately 4-5 times per day.  No vision changes, no weakness, no loss of sensation.  Currently asymptomatic. No recent fevers or infections.  The history is provided by the patient.    Past Medical History Past Medical History:  Diagnosis Date   Hypertension    Postmenopausal bleeding    Rash    both ankles x 3 weeks prescribed hydroxyazine prn and kenalog cream at urgent care 11-03-2019   Thyroid disease    Patient Active Problem List   Diagnosis Date Noted   OSA (obstructive sleep apnea) 09/17/2022   Snoring 05/16/2022   Shift work sleep disorder 05/16/2022   Abdominal pain 09/27/2021   Chronic constipation 09/27/2021   History of thyroid nodule 09/27/2021   Right knee pain 09/27/2021   Dizziness and giddiness 09/19/2020   Subclinical hypothyroidism 02/14/2020   Elevated TSH 10/01/2019   Prediabetes 09/30/2019   Candidate for statin therapy due to risk of future cardiovascular event 09/30/2019   Essential hypertension 11/07/2011   Home Medication(s) Prior to Admission medications   Medication Sig Start Date End Date Taking? Authorizing Provider  amLODipine (NORVASC) 10 MG tablet Take 1 tablet (10 mg total) by mouth daily. 07/12/22   Dorna Mai, MD  aspirin 81 MG chewable tablet Chew by mouth daily.    [provider]  hydrochlorothiazide (HYDRODIURIL) 25 MG tablet Take 1 tablet (25 mg total) by mouth daily. 10/09/22   Dorna Mai, MD  HYDROcodone-acetaminophen (NORCO/VICODIN) 5-325 MG tablet Take 1 tablet by mouth every 4 (four) hours as needed for moderate pain. 10/09/22   Edrick Kins, DPM  losartan (COZAAR) 100 MG tablet  Take 1 tablet (100 mg total) by mouth daily. 10/09/22   Dorna Mai, MD  naproxen (NAPROSYN) 500 MG tablet Take 1 tablet (500 mg total) by mouth 2 (two) times daily with a meal. 11/18/22   Edrick Kins, DPM  triamcinolone cream (KENALOG) 0.1 % Apply 1 application topically 2 (two) times daily. 08/14/22   Dorna Mai, MD                                                                                                                                    Allergies Codeine, Darvocet [propoxyphene n-acetaminophen], Demerol, Hydrocodone, and Penicillins  Review of Systems Review of Systems As noted in HPI  Physical Exam Vital Signs  I have reviewed the triage vital signs BP (!) 159/78 (BP Location: Right Arm)   Pulse 64   Temp 98 F (36.7 C) (Oral)   Resp 18   SpO2 98%   Physical Exam Vitals reviewed.  Constitutional:  General: She is not in acute distress.    Appearance: She is well-developed. She is not diaphoretic.  HENT:     Head: Normocephalic and atraumatic.     Right Ear: External ear normal.     Left Ear: External ear normal.     Nose: Nose normal.  Eyes:     General: No scleral icterus.    Conjunctiva/sclera: Conjunctivae normal.  Neck:     Trachea: Phonation normal.  Cardiovascular:     Rate and Rhythm: Normal rate and regular rhythm.  Pulmonary:     Effort: Pulmonary effort is normal. No respiratory distress.     Breath sounds: No stridor.  Abdominal:     General: There is no distension.  Musculoskeletal:        General: Normal range of motion.     Cervical back: Normal range of motion.  Neurological:     Mental Status: She is alert and oriented to person, place, and time.  Psychiatric:        Behavior: Behavior normal.     ED Results and Treatments Labs (all labs ordered are listed, but only abnormal results are displayed) Labs Reviewed  BASIC METABOLIC PANEL - Abnormal; Notable for the following components:      Result Value   Sodium 134 (*)     Glucose, Bld 112 (*)    All other components within normal limits  CBC                                                                                                                         EKG  EKG Interpretation  Date/Time:    Ventricular Rate:    PR Interval:    QRS Duration:   QT Interval:    QTC Calculation:   R Axis:     Text Interpretation:         Radiology CT Head Wo Contrast  Result Date: 11/18/2022 CLINICAL DATA:  Headache EXAM: CT HEAD WITHOUT CONTRAST TECHNIQUE: Contiguous axial images were obtained from the base of the skull through the vertex without intravenous contrast. RADIATION DOSE REDUCTION: This exam was performed according to the departmental dose-optimization program which includes automated exposure control, adjustment of the mA and/or kV according to patient size and/or use of iterative reconstruction technique. COMPARISON:  None Available. FINDINGS: Brain: No evidence of acute infarction, hemorrhage, hydrocephalus, extra-axial collection or mass lesion/mass effect. Vascular: Atherosclerotic calcifications are present within the cavernous internal carotid arteries. Skull: Normal. Negative for fracture or focal lesion. Sinuses/Orbits: No acute finding. Other: None. IMPRESSION: No acute intracranial abnormality. Electronically Signed   By: Ronney Asters M.D.   On: 11/18/2022 23:36    Medications Ordered in ED Medications - No data to display  Procedures Procedures  (including critical care time)  Medical Decision Making / ED Course  Click here for ABCD2, HEART and other calculators  Medical Decision Making Amount and/or Complexity of Data Reviewed Labs: ordered. Decision-making details documented in ED Course. Radiology: ordered and independent interpretation performed. Decision-making details documented in ED  Course.  Patient's description of symptoms are more consistent with paresthesias not necessarily overt headache. She was seen in [facility] where labs and CT head were ordered  CT head negative for ICH, mass effect CBC without leukocytosis or anemia Abdominal panel without significant electrolyte derangements or renal sufficiency.  Doubt CVA/TIA   Final Clinical Impression(s) / ED Diagnoses Final diagnoses:  Paresthesias   The patient appears reasonably screened and/or stabilized for discharge and I doubt any other medical condition or other [facility] requiring further screening, evaluation, or treatment in the ED at this time. I have discussed the findings, Dx and Tx plan with the patient/family who expressed understanding and agree(s) with the plan. Discharge instructions discussed at length. The patient/family was given strict return precautions who verbalized understanding of the instructions. No further questions at time of discharge.  Disposition: Discharge  Condition: Good  ED Discharge Orders     None       Follow Up: Dorna Mai, [place] [address] [place] [address] [phone]  Call  to schedule an appointment for close follow up           This chart was dictated using voice recognition software.  Despite best efforts to proofread,  errors can occur which can change the documentation meaning.    Fatima Blank, MD 11/19/22 [phone]

## 2022-12-04 ENCOUNTER — Telehealth: Payer: Self-pay | Admitting: Family Medicine

## 2022-12-04 NOTE — Telephone Encounter (Signed)
Medication Refill - Medication: phentermine (ADIPEX-P) 37.5 MG tablet   Has the patient contacted their pharmacy? No. (Agent: If no, request that the patient contact the pharmacy for the refill. If patient does not wish to contact the pharmacy document the reason why and proceed with request.) (Agent: If yes, when and what did the pharmacy advise?)  Preferred Pharmacy (with phone number or street name):  [facility] Phone: [phone]  Fax: [phone]     Has the patient been seen for an appointment in the last year OR does the patient have an upcoming appointment? Yes.    Agent: Please be advised that RX refills may take up to 3 business days. We ask that you follow-up with your pharmacy.

## 2022-12-06 ENCOUNTER — Other Ambulatory Visit (HOSPITAL_COMMUNITY): Payer: Self-pay

## 2022-12-06 DIAGNOSIS — G4733 Obstructive sleep apnea (adult) (pediatric): Secondary | ICD-10-CM | POA: Diagnosis not present

## 2022-12-09 ENCOUNTER — Encounter: Payer: Self-pay | Admitting: Podiatry

## 2022-12-09 ENCOUNTER — Telehealth: Payer: Self-pay

## 2022-12-09 ENCOUNTER — Ambulatory Visit (INDEPENDENT_AMBULATORY_CARE_PROVIDER_SITE_OTHER): Payer: 59 | Admitting: Podiatry

## 2022-12-09 ENCOUNTER — Ambulatory Visit (INDEPENDENT_AMBULATORY_CARE_PROVIDER_SITE_OTHER): Payer: 59

## 2022-12-09 DIAGNOSIS — M2042 Other hammer toe(s) (acquired), left foot: Secondary | ICD-10-CM

## 2022-12-09 DIAGNOSIS — Z9889 Other specified postprocedural states: Secondary | ICD-10-CM

## 2022-12-09 NOTE — Progress Notes (Signed)
   Chief Complaint  Patient presents with   Routine Post Op    POV # 4 DOS 10/09/22 --- HAMMERTOE ARTHROPLASTY 5TH DIGIT LEFT  "Its still so sore"    Subjective:  Patient presents today status post arthroplasty fifth digit left foot performed in office on 10/09/2022.  Patient states that there has been improvement.  She is now wearing sandals and wide fitting shoes.  It is somewhat tolerable.  She says that she is ready to go back to work light duty only for a few weeks  Past Medical History:  Diagnosis Date   Hypertension    Postmenopausal bleeding    Rash    both ankles x 3 weeks prescribed hydroxyazine prn and kenalog cream at urgent care 11-03-2019   Thyroid disease     Past Surgical History:  Procedure Laterality Date   COLONOSCOPY     DILATATION & CURETTAGE/HYSTEROSCOPY WITH MYOSURE N/A 11/10/2019   Procedure: DILATATION & CURETTAGE/HYSTEROSCOPY WITH MYOSURE;  Surgeon: Maxie Better, MD;  Location: [facility];  Service: Gynecology;  Laterality: N/A;   THYROID SURGERY     didn't remove thyroid   TUBAL LIGATION      Allergies  Allergen Reactions   Codeine Nausea Only   Darvocet [Propoxyphene N-Acetaminophen] Nausea Only   Demerol Nausea Only   Hydrocodone Nausea Only   Penicillins Nausea Only    Objective/Physical Exam Neurovascular status intact.  Skin incision nicely healed.  Significantly improved.  Slight sensitivity but overall doing well  Radiographic Exam LT foot 12/09/2022:  No significant change since prior x-rays.  Arthroplasty of the head of the proximal phalanx left fifth digit noted.  Clean osteotomy.  Well-healing surgical toe radiographically  Assessment: 1. s/p arthroplasty fifth digit left. DOS: 10/09/2022 in office   Plan of Care:  1. Patient was evaluated.  Overall she does feel significant improvement 2.  Continue topical diclofenac as needed 3.  Continue wearing wide fitting shoes that do not irritate or constrict the toebox  area 4.  Light debridement of the callus tissue overlying the incision site was performed with a 312 scalpel without incident or bleeding. 5.  Patient may return to work light duty only x 2 weeks.  She may resume full activity no restrictions after that 6.  Return to clinic as needed  Felecia Shelling, DPM [facility]  Dr. Felecia Shelling, DPM    [address] [place], [place] [address]                Office [phone]  Fax [phone]

## 2022-12-09 NOTE — Progress Notes (Signed)
Patient attempted to be outreached by Clovis Pu, PharmD Candidate on 4/12 to discuss hypertension. Left voicemail for patient to return our call at their convenience at [phone].   Clovis Pu, PharmD Candidate

## 2022-12-17 ENCOUNTER — Ambulatory Visit: Payer: 59 | Admitting: Adult Health

## 2022-12-20 ENCOUNTER — Other Ambulatory Visit (HOSPITAL_COMMUNITY): Payer: Self-pay

## 2022-12-23 ENCOUNTER — Encounter: Payer: Self-pay | Admitting: Podiatry

## 2022-12-24 ENCOUNTER — Encounter: Payer: Self-pay | Admitting: Podiatry

## 2022-12-27 ENCOUNTER — Encounter: Payer: Self-pay | Admitting: Gastroenterology

## 2023-01-02 ENCOUNTER — Ambulatory Visit: Payer: 59 | Admitting: Family Medicine

## 2023-01-03 ENCOUNTER — Other Ambulatory Visit (HOSPITAL_COMMUNITY): Payer: Self-pay

## 2023-01-03 ENCOUNTER — Encounter: Payer: Self-pay | Admitting: Family Medicine

## 2023-01-03 ENCOUNTER — Ambulatory Visit (INDEPENDENT_AMBULATORY_CARE_PROVIDER_SITE_OTHER): Payer: 59 | Admitting: Family Medicine

## 2023-01-03 VITALS — BP 145/87 | HR 62 | Temp 98.1°F | Resp 16 | Wt 260.0 lb

## 2023-01-03 DIAGNOSIS — I10 Essential (primary) hypertension: Secondary | ICD-10-CM | POA: Diagnosis not present

## 2023-01-03 DIAGNOSIS — Z6836 Body mass index (BMI) 36.0-36.9, adult: Secondary | ICD-10-CM

## 2023-01-03 DIAGNOSIS — Z7689 Persons encountering health services in other specified circumstances: Secondary | ICD-10-CM

## 2023-01-03 MED ORDER — PHENTERMINE HCL 37.5 MG PO TABS
37.5000 mg | ORAL_TABLET | Freq: Every day | ORAL | 0 refills | Status: DC
  Filled 2023-01-03: qty 30, 30d supply, fill #0

## 2023-01-03 MED ORDER — LOSARTAN POTASSIUM-HCTZ 100-25 MG PO TABS
1.0000 | ORAL_TABLET | Freq: Every day | ORAL | 1 refills | Status: DC
  Filled 2023-01-03: qty 90, 90d supply, fill #0
  Filled 2023-04-07: qty 90, 90d supply, fill #1

## 2023-01-05 DIAGNOSIS — G4733 Obstructive sleep apnea (adult) (pediatric): Secondary | ICD-10-CM | POA: Diagnosis not present

## 2023-01-09 ENCOUNTER — Encounter: Payer: Self-pay | Admitting: Family Medicine

## 2023-01-09 NOTE — Progress Notes (Signed)
Established Patient Office Visit  Subjective    Patient ID: Amy Schultz, female    DOB: Jul 13, 1956  Age: 67 y.o. MRN: [id number]  CC: No chief complaint on file.   HPI Amy Schultz presents for follow up of hypertension and weight management. Patient denies acute complaints or concerns.    Outpatient Encounter Medications as of 01/03/2023  Medication Sig   aspirin 81 MG chewable tablet Chew by mouth daily.   hydrochlorothiazide (HYDRODIURIL) 25 MG tablet Take 1 tablet (25 mg total) by mouth daily.   losartan (COZAAR) 100 MG tablet Take 1 tablet (100 mg total) by mouth daily.   losartan-hydrochlorothiazide (HYZAAR) 100-25 MG tablet Take 1 tablet by mouth daily.   naproxen (NAPROSYN) 500 MG tablet Take 1 tablet (500 mg total) by mouth 2 (two) times daily with a meal.   phentermine (ADIPEX-P) 37.5 MG tablet Take 1 tablet (37.5 mg total) by mouth daily before breakfast.   triamcinolone cream (KENALOG) 0.1 % Apply 1 application topically 2 (two) times daily.   [DISCONTINUED] amLODipine (NORVASC) 10 MG tablet Take 1 tablet (10 mg total) by mouth daily.   [DISCONTINUED] HYDROcodone-acetaminophen (NORCO/VICODIN) 5-325 MG tablet Take 1 tablet by mouth every 4 (four) hours as needed for moderate pain.   No facility-administered encounter medications on file as of 01/03/2023.    Past Medical History:  Diagnosis Date   Hypertension    Postmenopausal bleeding    Rash    both ankles x 3 weeks prescribed hydroxyazine prn and kenalog cream at urgent care 11-03-2019   Thyroid disease     Past Surgical History:  Procedure Laterality Date   COLONOSCOPY     DILATATION & CURETTAGE/HYSTEROSCOPY WITH MYOSURE N/A 11/10/2019   Procedure: DILATATION & CURETTAGE/HYSTEROSCOPY WITH MYOSURE;  Surgeon: Maxie Better, MD;  Location: [facility];  Service: Gynecology;  Laterality: N/A;   THYROID SURGERY     didn't remove thyroid   TUBAL LIGATION      Family History  Problem  Relation Age of Onset   Diabetes Mother    Hypertension Mother    Hypertension Sister    Breast cancer Sister        unsure age of onset   Breast cancer Maternal Aunt        unsure age of onset   Diabetes Maternal Grandmother    Colon cancer Neg Hx    Esophageal cancer Neg Hx    Rectal cancer Neg Hx    Stomach cancer Neg Hx    Colon polyps Neg Hx     Social History   Socioeconomic History   Marital status: Single    Spouse name: Not on file   Number of children: 3   Years of education: Not on file   Highest education level: Not on file  Occupational History   Not on file  Tobacco Use   Smoking status: Never   Smokeless tobacco: Never  Vaping Use   Vaping Use: Never used  Substance and Sexual Activity   Alcohol use: No   Drug use: No   Sexual activity: Not Currently    Birth control/protection: Surgical  Other Topics Concern   Not on file  Social History Narrative   Not on file   Social Determinants of Health   Financial Resource Strain: Not on file  Food Insecurity: Not on file  Transportation Needs: Not on file  Physical Activity: Not on file  Stress: Not on file  Social Connections: Not on  file  Intimate Partner Violence: Not on file    Review of Systems  All other systems reviewed and are negative.       Objective    BP (!) 145/87   Pulse 62   Temp 98.1 F (36.7 C) (Oral)   Resp 16   Wt 260 lb (117.9 kg)   SpO2 97%   BMI 36.26 kg/m   Physical Exam Vitals and nursing note reviewed.  Constitutional:      General: She is not in acute distress.    Appearance: She is obese.  Cardiovascular:     Rate and Rhythm: Normal rate and regular rhythm.  Pulmonary:     Effort: Pulmonary effort is normal.     Breath sounds: Normal breath sounds.  Abdominal:     Palpations: Abdomen is soft.     Tenderness: There is no abdominal tenderness.  Neurological:     General: No focal deficit present.     Mental Status: She is alert and oriented to  person, place, and time.         Assessment & Plan:   1. Essential hypertension Slightly elevated. Meds refilled. continue  2. Encounter for weight management Discussed dietary and activity options. Phentermine prescribed.   3. Class 2 severe obesity due to excess calories with serious comorbidity and body mass index (BMI) of 36.0 to 36.9 in adult [facility] As above    Return in about 4 weeks (around 01/31/2023) for follow up.   Tommie Raymond, MD

## 2023-01-13 ENCOUNTER — Encounter: Payer: Self-pay | Admitting: Gastroenterology

## 2023-01-29 ENCOUNTER — Ambulatory Visit (INDEPENDENT_AMBULATORY_CARE_PROVIDER_SITE_OTHER): Payer: 59

## 2023-01-29 ENCOUNTER — Ambulatory Visit (INDEPENDENT_AMBULATORY_CARE_PROVIDER_SITE_OTHER): Payer: 59 | Admitting: Podiatry

## 2023-01-29 DIAGNOSIS — M2042 Other hammer toe(s) (acquired), left foot: Secondary | ICD-10-CM | POA: Diagnosis not present

## 2023-01-29 NOTE — Progress Notes (Signed)
   Chief Complaint  Patient presents with   Routine Post Op    POV # 4 DOS 10/09/22 --- HAMMERTOE ARTHROPLASTY 5TH DIGIT LEFT  "Its still so sore"       Subjective:  Patient presents today status post arthroplasty fifth digit left foot performed in office on 10/09/2022.  Patient states that she continues to have some intermittent shooting sensations in the toe.  Overall it sounds like there is improvement however she continues to have some intermittent sharp shooting sensations that come and go within a matter of a few seconds.  Patient states that this occurs a few times a week.  Past Medical History:  Diagnosis Date   Hypertension    Postmenopausal bleeding    Rash    both ankles x 3 weeks prescribed hydroxyazine prn and kenalog cream at urgent care 11-03-2019   Thyroid disease     Past Surgical History:  Procedure Laterality Date   COLONOSCOPY     DILATATION & CURETTAGE/HYSTEROSCOPY WITH MYOSURE N/A 11/10/2019   Procedure: DILATATION & CURETTAGE/HYSTEROSCOPY WITH MYOSURE;  Surgeon: Maxie Better, MD;  Location:  SUR[facility]ERY C[facility];  Service: Gynecology;  Laterality: N/A;   THYROID SURGERY     didn't remove thyroid   TUBAL LIGATION      Allergies  Allergen Reactions   Codeine Nausea Only   Darvocet [Propoxyphene N-Acetaminophen] Nausea Only   Demerol Nausea Only   Hydrocodone Nausea Only   Penicillins Nausea Only    Objective/Physical Exam Neurovascular status intact.  Skin incision nicely healed.  Significantly improved.  There continues to be some slight sensitivity to the toe.  The toe is in good rectus alignment.  No noted.  Radiographic Exam LT foot 01/29/2023:  Unchanged.  No significant change since prior x-rays.  Arthroplasty of the head of the proximal phalanx left fifth digit noted.  Clean osteotomy.  Well-healing surgical toe radiographically  Assessment: 1. s/p arthroplasty fifth digit left. DOS: 10/09/2022 in office   Plan of Care:  1. Patient  was evaluated.   2.  There has been improvement in the patient only has occasional intermittent shooting sensations to the toe a few times per week.  Otherwise she states that it is mostly asymptomatic. 3.  Recommend good supportive tennis shoes that do not irritate or constrict the toebox area 4.  Continue full activity no restrictions.  Explained to the patient that the intermittent shooting sensation should slowly alleviate with time 5.  Return to clinic as needed  Felecia Shelling, DPM [facility]  Dr. Felecia Shelling, DPM    [address] [place], [place] [address]                Office [phone]  Fax [phone]

## 2023-02-05 DIAGNOSIS — G4733 Obstructive sleep apnea (adult) (pediatric): Secondary | ICD-10-CM | POA: Diagnosis not present

## 2023-02-12 ENCOUNTER — Ambulatory Visit (INDEPENDENT_AMBULATORY_CARE_PROVIDER_SITE_OTHER): Payer: 59 | Admitting: Family Medicine

## 2023-02-12 ENCOUNTER — Encounter: Payer: Self-pay | Admitting: Family Medicine

## 2023-02-12 ENCOUNTER — Other Ambulatory Visit (HOSPITAL_COMMUNITY): Payer: Self-pay

## 2023-02-12 VITALS — BP 146/89 | HR 76 | Temp 98.1°F | Resp 16 | Wt 256.4 lb

## 2023-02-12 DIAGNOSIS — Z7689 Persons encountering health services in other specified circumstances: Secondary | ICD-10-CM

## 2023-02-12 DIAGNOSIS — Z6836 Body mass index (BMI) 36.0-36.9, adult: Secondary | ICD-10-CM

## 2023-02-12 MED ORDER — PHENTERMINE HCL 37.5 MG PO TABS
37.5000 mg | ORAL_TABLET | Freq: Every day | ORAL | 0 refills | Status: DC
  Filled 2023-02-12: qty 30, 30d supply, fill #0

## 2023-02-13 NOTE — Progress Notes (Signed)
Established Patient Office Visit  Subjective    Patient ID: Amy Schultz, female    DOB: 29-Oct-1955  Age: 67 y.o. MRN: [id number]  CC: No chief complaint on file.   HPI Amy Schultz presents for routine weight management. Patient denies acute complaints or concerns.    Outpatient Encounter Medications as of 02/12/2023  Medication Sig   aspirin 81 MG chewable tablet Chew by mouth daily.   hydrochlorothiazide (HYDRODIURIL) 25 MG tablet Take 1 tablet (25 mg total) by mouth daily.   losartan (COZAAR) 100 MG tablet Take 1 tablet (100 mg total) by mouth daily.   losartan-hydrochlorothiazide (HYZAAR) 100-25 MG tablet Take 1 tablet by mouth daily.   naproxen (NAPROSYN) 500 MG tablet Take 1 tablet (500 mg total) by mouth 2 (two) times daily with a meal.   phentermine (ADIPEX-P) 37.5 MG tablet Take 1 tablet (37.5 mg total) by mouth daily before breakfast.   triamcinolone cream (KENALOG) 0.1 % Apply 1 application topically 2 (two) times daily.   [DISCONTINUED] phentermine (ADIPEX-P) 37.5 MG tablet Take 1 tablet (37.5 mg total) by mouth daily before breakfast.   No facility-administered encounter medications on file as of 02/12/2023.    Past Medical History:  Diagnosis Date   Hypertension    Postmenopausal bleeding    Rash    both ankles x 3 weeks prescribed hydroxyazine prn and kenalog cream at urgent care 11-03-2019   Thyroid disease     Past Surgical History:  Procedure Laterality Date   COLONOSCOPY     DILATATION & CURETTAGE/HYSTEROSCOPY WITH MYOSURE N/A 11/10/2019   Procedure: DILATATION & CURETTAGE/HYSTEROSCOPY WITH MYOSURE;  Surgeon: Maxie Better, MD;  Location: [facility];  Service: Gynecology;  Laterality: N/A;   THYROID SURGERY     didn't remove thyroid   TUBAL LIGATION      Family History  Problem Relation Age of Onset   Diabetes Mother    Hypertension Mother    Hypertension Sister    Breast cancer Sister        unsure age of onset   Breast  cancer Maternal Aunt        unsure age of onset   Diabetes Maternal Grandmother    Colon cancer Neg Hx    Esophageal cancer Neg Hx    Rectal cancer Neg Hx    Stomach cancer Neg Hx    Colon polyps Neg Hx     Social History   Socioeconomic History   Marital status: Single    Spouse name: Not on file   Number of children: 3   Years of education: Not on file   Highest education level: Not on file  Occupational History   Not on file  Tobacco Use   Smoking status: Never   Smokeless tobacco: Never  Vaping Use   Vaping Use: Never used  Substance and Sexual Activity   Alcohol use: No   Drug use: No   Sexual activity: Not Currently    Birth control/protection: Surgical  Other Topics Concern   Not on file  Social History Narrative   Not on file   Social Determinants of Health   Financial Resource Strain: Not on file  Food Insecurity: Not on file  Transportation Needs: Not on file  Physical Activity: Not on file  Stress: Not on file  Social Connections: Not on file  Intimate Partner Violence: Not on file    Review of Systems  All other systems reviewed and are negative.  Objective    BP (!) 146/89   Pulse 76   Temp 98.1 F (36.7 C) (Oral)   Resp 16   Wt 256 lb 6.4 oz (116.3 kg)   SpO2 96%   BMI 35.76 kg/m   Physical Exam Vitals and nursing note reviewed.  Constitutional:      General: She is not in acute distress.    Appearance: She is obese.  Cardiovascular:     Rate and Rhythm: Normal rate and regular rhythm.  Pulmonary:     Effort: Pulmonary effort is normal.     Breath sounds: Normal breath sounds.  Abdominal:     Palpations: Abdomen is soft.     Tenderness: There is no abdominal tenderness.  Neurological:     General: No focal deficit present.     Mental Status: She is alert and oriented to person, place, and time.         Assessment & Plan:   1. Encounter for weight management Discussed dietary and activity options. Phentermine  prescribed. Goal is 2-4lbs/mo weight loss  2. Class 2 severe obesity due to excess calories with serious comorbidity and body mass index (BMI) of 36.0 to 36.9 in adult [facility])   Return in about 4 weeks (around 03/12/2023) for follow up.   Tommie Raymond, MD

## 2023-03-07 DIAGNOSIS — G4733 Obstructive sleep apnea (adult) (pediatric): Secondary | ICD-10-CM | POA: Diagnosis not present

## 2023-03-10 ENCOUNTER — Other Ambulatory Visit (HOSPITAL_COMMUNITY): Payer: Self-pay

## 2023-03-10 ENCOUNTER — Ambulatory Visit (AMBULATORY_SURGERY_CENTER): Payer: 59 | Admitting: *Deleted

## 2023-03-10 VITALS — Ht 71.0 in | Wt 247.0 lb

## 2023-03-10 DIAGNOSIS — Z86010 Personal history of colonic polyps: Secondary | ICD-10-CM

## 2023-03-10 MED ORDER — NA SULFATE-K SULFATE-MG SULF 17.5-3.13-1.6 GM/177ML PO SOLN
1.0000 | Freq: Once | ORAL | 0 refills | Status: AC
  Filled 2023-03-10: qty 354, 1d supply, fill #0

## 2023-03-10 NOTE — Progress Notes (Signed)
Pt's name and DOB verified at the beginning of the pre-visit.  Pt denies any difficulty with ambulating,sitting, laying down or rolling side to side Gave both [facility] main # and MD on call # prior to instructions.  No egg or soy allergy known to patient  No issues known to pt with past sedation with any surgeries or procedures Pt denies having issues being intubated Pt has no issues moving head neck or swallowing No FH of Malignant Hyperthermia Pt is not on diet pills Pt is not on home 02  Pt is not on blood thinners  Pt denies issues with constipation  Pt is not on dialysis Pt denise any abnormal heart rhythms  Pt denies any upcoming cardiac testing Pt encouraged to use to use Singlecare or Goodrx to reduce cost  Patient's chart reviewed by Amy Schultz CNRA prior to pre-visit and patient appropriate for the [facility].  Pre-visit completed and red dot placed by patient's name on their procedure day (on provider's schedule).  . Visit by phone Pt states weight is 247 lb Instructed pt why it is important to and  to call if they have any changes in health or new medications. Directed them to the # given and on instructions.   Pt states they will.  Instructions reviewed with pt and pt states understanding. Instructed to review again prior to procedure. Pt states they will.  Instructions sent by mail with coupon and by my chart

## 2023-03-14 ENCOUNTER — Other Ambulatory Visit (HOSPITAL_COMMUNITY): Payer: Self-pay

## 2023-03-14 ENCOUNTER — Ambulatory Visit (INDEPENDENT_AMBULATORY_CARE_PROVIDER_SITE_OTHER): Payer: 59 | Admitting: Family Medicine

## 2023-03-14 ENCOUNTER — Encounter: Payer: Self-pay | Admitting: Family Medicine

## 2023-03-14 VITALS — BP 145/91 | HR 90 | Temp 98.1°F | Resp 16 | Wt 249.2 lb

## 2023-03-14 DIAGNOSIS — I10 Essential (primary) hypertension: Secondary | ICD-10-CM

## 2023-03-14 DIAGNOSIS — Z6834 Body mass index (BMI) 34.0-34.9, adult: Secondary | ICD-10-CM

## 2023-03-14 DIAGNOSIS — Z7689 Persons encountering health services in other specified circumstances: Secondary | ICD-10-CM

## 2023-03-14 MED ORDER — PHENTERMINE HCL 37.5 MG PO TABS
37.5000 mg | ORAL_TABLET | Freq: Every day | ORAL | 0 refills | Status: DC
  Filled 2023-03-14: qty 30, 30d supply, fill #0

## 2023-03-14 NOTE — Progress Notes (Unsigned)
Patient came in for monthly weight check. Patient has no other concerns today

## 2023-03-17 ENCOUNTER — Encounter: Payer: Self-pay | Admitting: Family Medicine

## 2023-03-17 NOTE — Progress Notes (Signed)
Established Patient Office Visit  Subjective    Patient ID: Amy Schultz, female    DOB: Jun 09, 1956  Age: 67 y.o. MRN: [id number]  CC: No chief complaint on file.   HPI Amy Schultz presents for routine weight management   Outpatient Encounter Medications as of 03/14/2023  Medication Sig   aspirin 81 MG chewable tablet Chew by mouth daily.   losartan-hydrochlorothiazide (HYZAAR) 100-25 MG tablet Take 1 tablet by mouth daily.   phentermine (ADIPEX-P) 37.5 MG tablet Take 1 tablet (37.5 mg total) by mouth daily before breakfast.   [DISCONTINUED] phentermine (ADIPEX-P) 37.5 MG tablet Take 1 tablet (37.5 mg total) by mouth daily before breakfast.   No facility-administered encounter medications on file as of 03/14/2023.    Past Medical History:  Diagnosis Date   Hypertension    Postmenopausal bleeding    Rash    both ankles x 3 weeks prescribed hydroxyazine prn and kenalog cream at urgent care 11-03-2019   Thyroid disease     Past Surgical History:  Procedure Laterality Date   COLONOSCOPY     DILATATION & CURETTAGE/HYSTEROSCOPY WITH MYOSURE N/A 11/10/2019   Procedure: DILATATION & CURETTAGE/HYSTEROSCOPY WITH MYOSURE;  Surgeon: Maxie Better, MD;  Location: [facility];  Service: Gynecology;  Laterality: N/A;   THYROID SURGERY     didn't remove thyroid   TUBAL LIGATION      Family History  Problem Relation Age of Onset   Diabetes Mother    Hypertension Mother    Hypertension Sister    Breast cancer Sister        unsure age of onset   Breast cancer Maternal Aunt        unsure age of onset   Diabetes Maternal Grandmother    Colon cancer Neg Hx    Esophageal cancer Neg Hx    Rectal cancer Neg Hx    Stomach cancer Neg Hx    Colon polyps Neg Hx     Social History   Socioeconomic History   Marital status: Single    Spouse name: Not on file   Number of children: 3   Years of education: Not on file   Highest education level: Not on file   Occupational History   Not on file  Tobacco Use   Smoking status: Never   Smokeless tobacco: Never  Vaping Use   Vaping status: Never Used  Substance and Sexual Activity   Alcohol use: No   Drug use: No   Sexual activity: Not Currently    Birth control/protection: Surgical  Other Topics Concern   Not on file  Social History Narrative   Not on file   Social Determinants of Health   Financial Resource Strain: Not on file  Food Insecurity: Not on file  Transportation Needs: Not on file  Physical Activity: Not on file  Stress: Not on file  Social Connections: Not on file  Intimate Partner Violence: Not on file    Review of Systems  All other systems reviewed and are negative.       Objective    BP (!) 145/91   Pulse 90   Temp 98.1 F (36.7 C) (Oral)   Resp 16   Wt 249 lb 3.2 oz (113 kg)   SpO2 96%   BMI 34.76 kg/m   Physical Exam Vitals and nursing note reviewed.  Constitutional:      General: She is not in acute distress.    Appearance: She is obese.  Cardiovascular:     Rate and Rhythm: Normal rate and regular rhythm.  Pulmonary:     Effort: Pulmonary effort is normal.     Breath sounds: Normal breath sounds.  Abdominal:     Palpations: Abdomen is soft.     Tenderness: There is no abdominal tenderness.  Neurological:     General: No focal deficit present.     Mental Status: She is alert and oriented to person, place, and time.         Assessment & Plan:   1. Encounter for weight management Phentermine prescribed  2. Class 2 severe obesity due to excess calories with serious comorbidity and body mass index (BMI) of 36.0 to 36.9 in adult (HCC)   3. Essential hypertension Slightly elevated reading. Consider d/c phentermine if remains elevated    No follow-ups on file.   Tommie Raymond, MD

## 2023-03-24 ENCOUNTER — Encounter: Payer: Self-pay | Admitting: Gastroenterology

## 2023-04-02 ENCOUNTER — Ambulatory Visit (AMBULATORY_SURGERY_CENTER): Payer: 59 | Admitting: Gastroenterology

## 2023-04-02 ENCOUNTER — Encounter: Payer: Self-pay | Admitting: Gastroenterology

## 2023-04-02 VITALS — BP 158/94 | HR 58 | Temp 96.8°F | Resp 12 | Ht 71.0 in | Wt 247.0 lb

## 2023-04-02 DIAGNOSIS — Z09 Encounter for follow-up examination after completed treatment for conditions other than malignant neoplasm: Secondary | ICD-10-CM

## 2023-04-02 DIAGNOSIS — Z86010 Personal history of colonic polyps: Secondary | ICD-10-CM | POA: Diagnosis not present

## 2023-04-02 DIAGNOSIS — D123 Benign neoplasm of transverse colon: Secondary | ICD-10-CM | POA: Diagnosis not present

## 2023-04-02 DIAGNOSIS — I10 Essential (primary) hypertension: Secondary | ICD-10-CM | POA: Diagnosis not present

## 2023-04-02 DIAGNOSIS — Z1211 Encounter for screening for malignant neoplasm of colon: Secondary | ICD-10-CM | POA: Diagnosis not present

## 2023-04-02 MED ORDER — SODIUM CHLORIDE 0.9 % IV SOLN: 500.0000 mL | Freq: Once | INTRAVENOUS | Status: DC

## 2023-04-02 NOTE — Progress Notes (Signed)
Uneventful anesthetic. Report to pacu rn. Vss. Care resumed by rn. 

## 2023-04-02 NOTE — Progress Notes (Signed)
Pt's states no medical or surgical changes since previsit or office visit. 

## 2023-04-02 NOTE — Patient Instructions (Addendum)
High fiber diet. Use FiberCon 1-2 tablets PO daily. Continue present medications. Await pathology results. Repeat colonoscopy in 5-7 years for surveillance based on final pathology.  Please read over handouts about polyps, hemorrhoids and high fiber diets                               YOU HAD AN ENDOSCOPIC PROCEDURE TODAY AT THE [facility]:   Refer to the procedure report that was given to you for any specific questions about what was found during the examination.  If the procedure report does not answer your questions, please call your gastroenterologist to clarify.  If you requested that your care partner not be given the details of your procedure findings, then the procedure report has been included in a sealed envelope for you to review at your convenience later.  YOU SHOULD EXPECT: Some feelings of bloating in the abdomen. Passage of more gas than usual.  Walking can help get rid of the air that was put into your GI tract during the procedure and reduce the bloating. If you had a lower endoscopy (such as a colonoscopy or flexible sigmoidoscopy) you may notice spotting of blood in your stool or on the toilet paper. If you underwent a bowel prep for your procedure, you may not have a normal bowel movement for a few days.  Please Note:  You might notice some irritation and congestion in your nose or some drainage.  This is from the oxygen used during your procedure.  There is no need for concern and it should clear up in a day or so.  SYMPTOMS TO REPORT IMMEDIATELY:  Following lower endoscopy (colonoscopy or flexible sigmoidoscopy):  Excessive amounts of blood in the stool  Significant tenderness or worsening of abdominal pains  Swelling of the abdomen that is new, acute  Fever of 100F or higher  For urgent or emergent issues, a gastroenterologist can be reached at any hour by calling [phone]. Do not use MyChart messaging for urgent concerns.    DIET:  We do  recommend a small meal at first, but then you may proceed to your regular diet.  Drink plenty of fluids but you should avoid alcoholic beverages for 24 hours.  ACTIVITY:  You should plan to take it easy for the rest of today and you should NOT DRIVE or use heavy machinery until tomorrow (because of the sedation medicines used during the test).    FOLLOW UP: Our staff will call the number listed on your records the next business day following your procedure.  We will call around 7:15- 8:00 am to check on you and address any questions or concerns that you may have regarding the information given to you following your procedure. If we do not reach you, we will leave a message.     If any biopsies were taken you will be contacted by phone or by letter within the next 1-3 weeks.  Please call us at [phone] if you have not heard about the biopsies in 3 weeks.    SIGNATURES/CONFIDENTIALITY: You and/or your care partner have signed paperwork which will be entered into your electronic medical record.  These signatures attest to the fact that that the information above on your After Visit Summary has been reviewed and is understood.  Full responsibility of the confidentiality of this discharge information lies with you and/or your care-partner.

## 2023-04-02 NOTE — Progress Notes (Signed)
Called to room to assist during endoscopic procedure.  Patient ID and intended procedure confirmed with present staff. Received instructions for my participation in the procedure from the performing physician.  

## 2023-04-02 NOTE — Op Note (Signed)
[facility] Patient Name: Amy Schultz Procedure Date: 04/02/2023 8:52 AM MRN: [id number] Endoscopist: Corliss Parish , MD, [id number] Age: 67 Referring MD:  Date of Birth: July 08, 1956 Gender: Female Account #: [id number] Procedure:                Colonoscopy Indications:              Surveillance: Personal history of adenomatous                            polyps on last colonoscopy 5 years ago Medicines:                Monitored Anesthesia Care Procedure:                Pre-Anesthesia Assessment:                           - Prior to the procedure, a History and Physical                            was performed, and patient medications and                            allergies were reviewed. The patient's tolerance of                            previous anesthesia was also reviewed. The risks                            and benefits of the procedure and the sedation                            options and risks were discussed with the patient.                            All questions were answered, and informed consent                            was obtained. Prior Anticoagulants: The patient has                            taken no anticoagulant or antiplatelet agents                            except for aspirin. ASA Grade Assessment: II - A                            patient with mild systemic disease. After reviewing                            the risks and benefits, the patient was deemed in                            satisfactory condition to undergo the procedure.  After obtaining informed consent, the colonoscope                            was passed under direct vision. Throughout the                            procedure, the patient's blood pressure, pulse, and                            oxygen saturations were monitored continuously. The                            Olympus Scope SN: [id number] was introduced through                            the  anus and advanced to the 3 cm into the ileum.                            The colonoscopy was performed without difficulty.                            The patient tolerated the procedure. The quality of                            the bowel preparation was adequate. The terminal                            ileum, ileocecal valve, appendiceal orifice, and                            rectum were photographed. Scope In: 9:20:01 AM Scope Out: 9:35:25 AM Scope Withdrawal Time: 0 hours 11 minutes 32 seconds  Total Procedure Duration: 0 hours 15 minutes 24 seconds  Findings:                 The digital rectal exam findings include                            hemorrhoids. Pertinent negatives include no                            palpable rectal lesions.                           The colon (entire examined portion) revealed                            moderately excessive looping.                           The terminal ileum and ileocecal valve appeared                            normal.  Two sessile polyps were found in the transverse                            colon and hepatic flexure. The polyps were 3 to 7                            mm in size. These polyps were removed with a cold                            snare. Resection and retrieval were complete.                           Normal mucosa was found in the entire colon                            otherwise.                           Non-bleeding non-thrombosed external and internal                            hemorrhoids were found during retroflexion, during                            perianal exam and during digital exam. The                            hemorrhoids were Grade II (internal hemorrhoids                            that prolapse but reduce spontaneously). Complications:            No immediate complications. Estimated Blood Loss:     Estimated blood loss was minimal. Impression:               - Hemorrhoids  found on digital rectal exam.                           - There was significant looping of the colon.                           - The examined portion of the ileum was normal.                           - Two 3 to 7 mm polyps in the transverse colon and                            at the hepatic flexure, removed with a cold snare.                            Resected and retrieved.                           - Normal mucosa in the entire examined colon  otherwise.                           - Non-bleeding non-thrombosed external and internal                            hemorrhoids. Recommendation:           - The patient will be observed post-procedure,                            until all discharge criteria are met.                           - Discharge patient to home.                           - Patient has a contact number available for                            emergencies. The signs and symptoms of potential                            delayed complications were discussed with the                            patient. Return to normal activities tomorrow.                            Written discharge instructions were provided to the                            patient.                           - High fiber diet.                           - Use FiberCon 1-2 tablets PO daily.                           - Continue present medications.                           - Await pathology results.                           - Repeat colonoscopy in 5-7 years for surveillance                            based on final pathology.                           - The findings and recommendations were discussed                            with the patient.                           -  The findings and recommendations were discussed                            with the patient's family. Corliss Parish, MD 04/02/2023 9:39:50 AM

## 2023-04-02 NOTE — Progress Notes (Signed)
GASTROENTEROLOGY PROCEDURE H&P NOTE   Primary Care Physician: Georganna Skeans, MD  HPI: Amy Schultz is a 67 y.o. female who presents for Colonoscopy for surveillance.  Past Medical History:  Diagnosis Date   Hypertension    Postmenopausal bleeding    Rash    both ankles x 3 weeks prescribed hydroxyazine prn and kenalog cream at urgent care 11-03-2019   Thyroid disease    Past Surgical History:  Procedure Laterality Date   COLONOSCOPY     DILATATION & CURETTAGE/HYSTEROSCOPY WITH MYOSURE N/A 11/10/2019   Procedure: DILATATION & CURETTAGE/HYSTEROSCOPY WITH MYOSURE;  Surgeon: Maxie Better, MD;  Location: [facility];  Service: Gynecology;  Laterality: N/A;   THYROID SURGERY     didn't remove thyroid   TUBAL LIGATION     Current Outpatient Medications  Medication Sig Dispense Refill   aspirin 81 MG chewable tablet Chew by mouth daily.     losartan-hydrochlorothiazide (HYZAAR) 100-25 MG tablet Take 1 tablet by mouth daily. 90 tablet 1   phentermine (ADIPEX-P) 37.5 MG tablet Take 1 tablet (37.5 mg total) by mouth daily before breakfast. 30 tablet 0   Current Facility-Administered Medications  Medication Dose Route Frequency Provider Last Rate Last Admin   0.9 %  sodium chloride infusion  500 mL Intravenous Once Mansouraty, Netty Starring., MD        Current Outpatient Medications:    aspirin 81 MG chewable tablet, Chew by mouth daily., Disp: , Rfl:    losartan-hydrochlorothiazide (HYZAAR) 100-25 MG tablet, Take 1 tablet by mouth daily., Disp: 90 tablet, Rfl: 1   phentermine (ADIPEX-P) 37.5 MG tablet, Take 1 tablet (37.5 mg total) by mouth daily before breakfast., Disp: 30 tablet, Rfl: 0  Current Facility-Administered Medications:    0.9 %  sodium chloride infusion, 500 mL, Intravenous, Once, Mansouraty, Netty Starring., MD Allergies  Allergen Reactions   Codeine Nausea Only   Darvocet [Propoxyphene N-Acetaminophen] Nausea Only   Demerol Nausea Only    Hydrocodone Nausea Only   Penicillins Nausea Only   Family History  Problem Relation Age of Onset   Diabetes Mother    Hypertension Mother    Hypertension Sister    Breast cancer Sister        unsure age of onset   Breast cancer Maternal Aunt        unsure age of onset   Diabetes Maternal Grandmother    Colon cancer Neg Hx    Esophageal cancer Neg Hx    Rectal cancer Neg Hx    Stomach cancer Neg Hx    Colon polyps Neg Hx    Social History   Socioeconomic History   Marital status: Single    Spouse name: Not on file   Number of children: 3   Years of education: Not on file   Highest education level: Not on file  Occupational History   Not on file  Tobacco Use   Smoking status: Never   Smokeless tobacco: Never  Vaping Use   Vaping status: Never Used  Substance and Sexual Activity   Alcohol use: No   Drug use: No   Sexual activity: Not Currently    Birth control/protection: Surgical  Other Topics Concern   Not on file  Social History Narrative   Not on file   Social Determinants of Health   Financial Resource Strain: Not on file  Food Insecurity: Not on file  Transportation Needs: Not on file  Physical Activity: Not on file  Stress: Not  on file  Social Connections: Not on file  Intimate Partner Violence: Not on file    Physical Exam: Today's Vitals   04/02/23 0802 04/02/23 0811 04/02/23 0900 04/02/23 0905  BP: (!) 190/95  (!) 183/94 (!) 181/91  Pulse: 66     Resp:   10 12  Temp:  (!) 96.8 F (36 C)    SpO2: 97%  98% 100%  Weight: 247 lb (112 kg)     Height: 5\' 11"  (1.803 m)      Body mass index is 34.45 kg/m. GEN: NAD EYE: Sclerae anicteric ENT: MMM CV: Non-tachycardic GI: Soft, NT/ND NEURO:  Alert & Oriented x 3  Lab Results: No results for input(s): "WBC", "HGB", "HCT", "PLT" in the last 72 hours. BMET No results for input(s): "NA", "K", "CL", "CO2", "GLUCOSE", "BUN", "CREATININE", "CALCIUM" in the last 72 hours. LFT No results for  input(s): "PROT", "ALBUMIN", "AST", "ALT", "ALKPHOS", "BILITOT", "BILIDIR", "IBILI" in the last 72 hours. PT/INR No results for input(s): "LABPROT", "INR" in the last 72 hours.   Impression / Plan: This is a 67 y.o.female who presents for Colonoscopy for surveillance.  The risks and benefits of endoscopic evaluation/treatment were discussed with the patient and/or family; these include but are not limited to the risk of perforation, infection, bleeding, missed lesions, lack of diagnosis, severe illness requiring hospitalization, as well as anesthesia and sedation related illnesses.  The patient's history has been reviewed, patient examined, no change in status, and deemed stable for procedure.  The patient and/or family is agreeable to proceed.    Corliss Parish, MD  Gastroenterology Advanced Endoscopy Office # [phone]

## 2023-04-03 ENCOUNTER — Telehealth: Payer: Self-pay

## 2023-04-03 NOTE — Telephone Encounter (Signed)
  Follow up Call-     04/02/2023    8:11 AM  Call back number  Post procedure Call Back phone  # [phone]  Permission to leave phone message Yes     Patient questions:  Do you have a fever, pain , or abdominal swelling? No. Pain Score  0 *  Have you tolerated food without any problems? Yes.    Have you been able to return to your normal activities? Yes.    Do you have any questions about your discharge instructions: Diet   No. Medications  No. Follow up visit  No.  Do you have questions or concerns about your Care? No.  Actions: * If pain score is 4 or above: No action needed, pain <4.

## 2023-04-07 ENCOUNTER — Encounter: Payer: Self-pay | Admitting: Gastroenterology

## 2023-04-07 ENCOUNTER — Other Ambulatory Visit: Payer: Self-pay | Admitting: Family Medicine

## 2023-04-07 ENCOUNTER — Other Ambulatory Visit (HOSPITAL_COMMUNITY): Payer: Self-pay

## 2023-04-07 DIAGNOSIS — G4733 Obstructive sleep apnea (adult) (pediatric): Secondary | ICD-10-CM | POA: Diagnosis not present

## 2023-04-07 DIAGNOSIS — Z1231 Encounter for screening mammogram for malignant neoplasm of breast: Secondary | ICD-10-CM

## 2023-04-15 ENCOUNTER — Ambulatory Visit: Payer: 59 | Admitting: Family Medicine

## 2023-04-17 ENCOUNTER — Other Ambulatory Visit (HOSPITAL_COMMUNITY): Payer: Self-pay

## 2023-05-06 ENCOUNTER — Encounter: Payer: Self-pay | Admitting: Family Medicine

## 2023-05-06 ENCOUNTER — Ambulatory Visit (INDEPENDENT_AMBULATORY_CARE_PROVIDER_SITE_OTHER): Payer: No Typology Code available for payment source | Admitting: Family Medicine

## 2023-05-06 ENCOUNTER — Other Ambulatory Visit (HOSPITAL_COMMUNITY): Payer: Self-pay

## 2023-05-06 VITALS — BP 157/101 | HR 78 | Temp 98.2°F | Resp 16 | Ht 71.0 in | Wt 250.8 lb

## 2023-05-06 DIAGNOSIS — E6609 Other obesity due to excess calories: Secondary | ICD-10-CM

## 2023-05-06 DIAGNOSIS — Z7689 Persons encountering health services in other specified circumstances: Secondary | ICD-10-CM | POA: Diagnosis not present

## 2023-05-06 DIAGNOSIS — Z6834 Body mass index (BMI) 34.0-34.9, adult: Secondary | ICD-10-CM

## 2023-05-06 DIAGNOSIS — I10 Essential (primary) hypertension: Secondary | ICD-10-CM

## 2023-05-06 MED ORDER — PHENTERMINE HCL 37.5 MG PO TABS
37.5000 mg | ORAL_TABLET | Freq: Every day | ORAL | 0 refills | Status: DC
  Filled 2023-05-06: qty 30, 30d supply, fill #0

## 2023-05-06 MED ORDER — LOSARTAN POTASSIUM-HCTZ 100-25 MG PO TABS
1.0000 | ORAL_TABLET | Freq: Every day | ORAL | 1 refills | Status: DC
  Filled 2023-05-06: qty 90, 90d supply, fill #0
  Filled 2023-08-07: qty 88, 88d supply, fill #1
  Filled 2023-08-07: qty 90, 90d supply, fill #1
  Filled 2023-08-07: qty 88, 88d supply, fill #1
  Filled 2023-08-07: qty 2, 2d supply, fill #1

## 2023-05-06 NOTE — Progress Notes (Unsigned)
Follow up weight

## 2023-05-07 ENCOUNTER — Other Ambulatory Visit (HOSPITAL_COMMUNITY): Payer: Self-pay

## 2023-05-07 ENCOUNTER — Encounter: Payer: Self-pay | Admitting: Family Medicine

## 2023-05-07 ENCOUNTER — Ambulatory Visit
Admission: RE | Admit: 2023-05-07 | Discharge: 2023-05-07 | Disposition: A | Discharge status: Home / Self Care | Payer: No Typology Code available for payment source | Source: Ambulatory Visit | Attending: Family Medicine | Admitting: Family Medicine

## 2023-05-07 DIAGNOSIS — Z1231 Encounter for screening mammogram for malignant neoplasm of breast: Secondary | ICD-10-CM

## 2023-05-07 NOTE — Progress Notes (Signed)
Established Patient Office Visit  Subjective    Patient ID: Amy Schultz, female    DOB: 1955-10-29  Age: 67 y.o. MRN: [id number]  CC:  Chief Complaint  Patient presents with   Follow-up    weight    HPI Amy Schultz presents for weight management. Patient denies acute complaints or concerns.   Outpatient Encounter Medications as of 05/06/2023  Medication Sig   aspirin 81 MG chewable tablet Chew by mouth daily.   [DISCONTINUED] losartan-hydrochlorothiazide (HYZAAR) 100-25 MG tablet Take 1 tablet by mouth daily.   [DISCONTINUED] phentermine (ADIPEX-P) 37.5 MG tablet Take 1 tablet (37.5 mg total) by mouth daily before breakfast.   losartan-hydrochlorothiazide (HYZAAR) 100-25 MG tablet Take 1 tablet by mouth daily.   phentermine (ADIPEX-P) 37.5 MG tablet Take 1 tablet (37.5 mg total) by mouth daily before breakfast.   No facility-administered encounter medications on file as of 05/06/2023.    Past Medical History:  Diagnosis Date   Hypertension    Postmenopausal bleeding    Rash    both ankles x 3 weeks prescribed hydroxyazine prn and kenalog cream at urgent care 11-03-2019   Thyroid disease     Past Surgical History:  Procedure Laterality Date   COLONOSCOPY     DILATATION & CURETTAGE/HYSTEROSCOPY WITH MYOSURE N/A 11/10/2019   Procedure: DILATATION & CURETTAGE/HYSTEROSCOPY WITH MYOSURE;  Surgeon: Amy Better, MD;  Location: [facility];  Service: Gynecology;  Laterality: N/A;   THYROID SURGERY     didn't remove thyroid   TUBAL LIGATION      Family History  Problem Relation Age of Onset   Diabetes Mother    Hypertension Mother    Hypertension Sister    Breast cancer Sister        unsure age of onset   Breast cancer Maternal Aunt        unsure age of onset   Diabetes Maternal Grandmother    Colon cancer Neg Hx    Esophageal cancer Neg Hx    Rectal cancer Neg Hx    Stomach cancer Neg Hx    Colon polyps Neg Hx     Social History    Socioeconomic History   Marital status: Single    Spouse name: Not on file   Number of children: 3   Years of education: Not on file   Highest education level: Not on file  Occupational History   Not on file  Tobacco Use   Smoking status: Never   Smokeless tobacco: Never  Vaping Use   Vaping status: Never Used  Substance and Sexual Activity   Alcohol use: No   Drug use: No   Sexual activity: Not Currently    Birth control/protection: Surgical  Other Topics Concern   Not on file  Social History Narrative   Not on file   Social Determinants of Health   Financial Resource Strain: Low Risk  (05/06/2023)   Overall Financial Resource Strain (CARDIA)    Difficulty of Paying Living Expenses: Not hard at all  Food Insecurity: No Food Insecurity (05/06/2023)   Hunger Vital Sign    Worried About Running Out of Food in the Last Year: Never true    Ran Out of Food in the Last Year: Never true  Transportation Needs: No Transportation Needs (05/06/2023)   PRAPARE - Administrator, Civil Service (Medical): No    Lack of Transportation (Non-Medical): No  Physical Activity: Insufficiently Active (05/06/2023)   Exercise Vital Sign  Days of Exercise per Week: 3 days    Minutes of Exercise per Session: 30 min  Stress: No Stress Concern Present (05/06/2023)   [employer] of Occupational Health - Occupational Stress Questionnaire    Feeling of Stress : Not at all  Social Connections: Moderately Integrated (05/06/2023)   Social Connection and Isolation Panel [NHANES]    Frequency of Communication with Friends and Family: More than three times a week    Frequency of Social Gatherings with Friends and Family: More than three times a week    Attends Religious Services: More than 4 times per year    Active Member of [employer] or Organizations: Yes    Attends Banker Meetings: More than 4 times per year    Marital Status: Divorced  Intimate Partner Violence: Not At  Risk (05/06/2023)   Humiliation, Afraid, Rape, and Kick questionnaire    Fear of Current or Ex-Partner: No    Emotionally Abused: No    Physically Abused: No    Sexually Abused: No    Review of Systems  All other systems reviewed and are negative.       Objective    BP (!) 157/101 (BP Location: Left Arm, Patient Position: Sitting, Cuff Size: Large)   Pulse 78   Temp 98.2 F (36.8 C) (Oral)   Resp 16   Ht 5\' 11"  (1.803 m)   Wt 250 lb 12.8 oz (113.8 kg)   SpO2 98%   BMI 34.98 kg/m   Physical Exam Vitals and nursing note reviewed.  Constitutional:      General: She is not in acute distress.    Appearance: She is obese.  Cardiovascular:     Rate and Rhythm: Normal rate and regular rhythm.  Pulmonary:     Effort: Pulmonary effort is normal.     Breath sounds: Normal breath sounds.  Abdominal:     Palpations: Abdomen is soft.     Tenderness: There is no abdominal tenderness.  Neurological:     General: No focal deficit present.     Mental Status: She is alert and oriented to person, place, and time.         Assessment & Plan:  1. Essential hypertension Elevated readings. Continue.   2. Encounter for weight management Phentermine refilled.   3. Class 1 obesity due to excess calories with serious comorbidity and body mass index (BMI) of 34.0 to 34.9 in adult     No follow-ups on file.   Amy Raymond, MD

## 2023-05-08 ENCOUNTER — Other Ambulatory Visit: Payer: Self-pay

## 2023-05-08 NOTE — Progress Notes (Signed)
   Amy Schultz 1955/12/13 [id number]  Patient attempted to be outreached by Thomasene Ripple, PharmD Candidate to discuss hypertension. Left voicemail for patient to return our call at their convenience at [phone].  Thomasene Ripple, Student-PharmD

## 2023-06-05 ENCOUNTER — Encounter: Payer: Self-pay | Admitting: Family Medicine

## 2023-06-05 ENCOUNTER — Other Ambulatory Visit (HOSPITAL_COMMUNITY): Payer: Self-pay

## 2023-06-05 ENCOUNTER — Ambulatory Visit (INDEPENDENT_AMBULATORY_CARE_PROVIDER_SITE_OTHER): Payer: No Typology Code available for payment source | Admitting: Family Medicine

## 2023-06-05 VITALS — BP 163/101 | HR 86 | Temp 98.6°F | Resp 16 | Ht 71.0 in | Wt 251.4 lb

## 2023-06-05 DIAGNOSIS — E66812 Obesity, class 2: Secondary | ICD-10-CM

## 2023-06-05 DIAGNOSIS — I10 Essential (primary) hypertension: Secondary | ICD-10-CM

## 2023-06-05 DIAGNOSIS — Z6835 Body mass index (BMI) 35.0-35.9, adult: Secondary | ICD-10-CM | POA: Diagnosis not present

## 2023-06-05 MED ORDER — METOPROLOL SUCCINATE ER 25 MG PO TB24
25.0000 mg | ORAL_TABLET | Freq: Every day | ORAL | 0 refills | Status: DC
  Filled 2023-06-05: qty 90, 90d supply, fill #0

## 2023-06-09 ENCOUNTER — Encounter: Payer: Self-pay | Admitting: Family Medicine

## 2023-06-09 NOTE — Progress Notes (Signed)
Established Patient Office Visit  Subjective    Patient ID: Amy Schultz, female    DOB: Feb 24, 1956  Age: 67 y.o. MRN: [id number]  CC:  Chief Complaint  Patient presents with   Follow-up    HPI Amy Schultz presents for follow up of chronic med issues. Patient denies acute complaints or concerns.   Outpatient Encounter Medications as of 06/05/2023  Medication Sig   aspirin 81 MG chewable tablet Chew by mouth daily.   losartan-hydrochlorothiazide (HYZAAR) 100-25 MG tablet Take 1 tablet by mouth daily.   metoprolol succinate (TOPROL-XL) 25 MG 24 hr tablet Take 1 tablet (25 mg total) by mouth daily.   phentermine (ADIPEX-P) 37.5 MG tablet Take 1 tablet (37.5 mg total) by mouth daily before breakfast.   No facility-administered encounter medications on file as of 06/05/2023.    Past Medical History:  Diagnosis Date   Hypertension    Postmenopausal bleeding    Rash    both ankles x 3 weeks prescribed hydroxyazine prn and kenalog cream at urgent care 11-03-2019   Thyroid disease     Past Surgical History:  Procedure Laterality Date   COLONOSCOPY     DILATATION & CURETTAGE/HYSTEROSCOPY WITH MYOSURE N/A 11/10/2019   Procedure: DILATATION & CURETTAGE/HYSTEROSCOPY WITH MYOSURE;  Surgeon: Maxie Better, MD;  Location: [facility];  Service: Gynecology;  Laterality: N/A;   THYROID SURGERY     didn't remove thyroid   TUBAL LIGATION      Family History  Problem Relation Age of Onset   Diabetes Mother    Hypertension Mother    Hypertension Sister    Breast cancer Sister        unsure age of onset   Breast cancer Maternal Aunt        unsure age of onset   Diabetes Maternal Grandmother    Colon cancer Neg Hx    Esophageal cancer Neg Hx    Rectal cancer Neg Hx    Stomach cancer Neg Hx    Colon polyps Neg Hx     Social History   Socioeconomic History   Marital status: Single    Spouse name: Not on file   Number of children: 3   Years of education:  Not on file   Highest education level: Not on file  Occupational History   Not on file  Tobacco Use   Smoking status: Never   Smokeless tobacco: Never  Vaping Use   Vaping status: Never Used  Substance and Sexual Activity   Alcohol use: No   Drug use: No   Sexual activity: Not Currently    Birth control/protection: Surgical  Other Topics Concern   Not on file  Social History Narrative   Not on file   Social Determinants of Health   Financial Resource Strain: Low Risk  (05/06/2023)   Overall Financial Resource Strain (CARDIA)    Difficulty of Paying Living Expenses: Not hard at all  Food Insecurity: No Food Insecurity (05/06/2023)   Hunger Vital Sign    Worried About Running Out of Food in the Last Year: Never true    Ran Out of Food in the Last Year: Never true  Transportation Needs: No Transportation Needs (05/06/2023)   PRAPARE - Administrator, Civil Service (Medical): No    Lack of Transportation (Non-Medical): No  Physical Activity: Insufficiently Active (05/06/2023)   Exercise Vital Sign    Days of Exercise per Week: 3 days    Minutes  of Exercise per Session: 30 min  Stress: No Stress Concern Present (05/06/2023)   [employer] of Occupational Health - Occupational Stress Questionnaire    Feeling of Stress : Not at all  Social Connections: Moderately Integrated (05/06/2023)   Social Connection and Isolation Panel [NHANES]    Frequency of Communication with Friends and Family: More than three times a week    Frequency of Social Gatherings with Friends and Family: More than three times a week    Attends Religious Services: More than 4 times per year    Active Member of [employer] or Organizations: Yes    Attends Banker Meetings: More than 4 times per year    Marital Status: Divorced  Intimate Partner Violence: Not At Risk (05/06/2023)   Humiliation, Afraid, Rape, and Kick questionnaire    Fear of Current or Ex-Partner: No    Emotionally Abused:  No    Physically Abused: No    Sexually Abused: No    Review of Systems  All other systems reviewed and are negative.       Objective    BP (!) 163/101 (BP Location: Left Arm, Patient Position: Sitting, Cuff Size: Large)   Pulse 86   Temp 98.6 F (37 C) (Oral)   Resp 16   Ht 5\' 11"  (1.803 m)   Wt 251 lb 6.4 oz (114 kg)   SpO2 96%   BMI 35.06 kg/m   Physical Exam Vitals and nursing note reviewed.  Constitutional:      General: She is not in acute distress.    Appearance: She is obese.  Cardiovascular:     Rate and Rhythm: Normal rate and regular rhythm.  Pulmonary:     Effort: Pulmonary effort is normal.     Breath sounds: Normal breath sounds.  Abdominal:     Palpations: Abdomen is soft.     Tenderness: There is no abdominal tenderness.  Neurological:     General: No focal deficit present.     Mental Status: She is alert and oriented to person, place, and time.         Assessment & Plan:   1. Uncontrolled hypertension Readings elevated. Discussed compliance. Will add metoprolol to present regimen and monitor.   2. Class 2 severe obesity due to excess calories with serious comorbidity and body mass index (BMI) of 35.0 to 35.9 in adult [facility]) Will defer phentermine until BP is under better control. Patient to continue dietary/activity options.   Return in about 2 weeks (around 06/19/2023) for follow up.   Tommie Raymond, MD

## 2023-07-08 DIAGNOSIS — G4733 Obstructive sleep apnea (adult) (pediatric): Secondary | ICD-10-CM | POA: Diagnosis not present

## 2023-07-17 ENCOUNTER — Ambulatory Visit: Payer: No Typology Code available for payment source | Admitting: Family Medicine

## 2023-08-07 ENCOUNTER — Other Ambulatory Visit (HOSPITAL_COMMUNITY): Payer: Self-pay

## 2023-08-07 ENCOUNTER — Other Ambulatory Visit: Payer: Self-pay | Admitting: Family Medicine

## 2023-08-07 DIAGNOSIS — G4733 Obstructive sleep apnea (adult) (pediatric): Secondary | ICD-10-CM | POA: Diagnosis not present

## 2023-08-07 MED ORDER — METOPROLOL SUCCINATE ER 25 MG PO TB24
25.0000 mg | ORAL_TABLET | Freq: Every day | ORAL | 1 refills | Status: DC
  Filled 2023-08-07 – 2023-09-01 (×2): qty 90, 90d supply, fill #0
  Filled 2023-10-31: qty 90, 90d supply, fill #1

## 2023-08-07 NOTE — Telephone Encounter (Signed)
Requested Prescriptions  Pending Prescriptions Disp Refills   metoprolol succinate (TOPROL-XL) 25 MG 24 hr tablet 90 tablet 1    Sig: Take 1 tablet (25 mg total) by mouth daily.     Cardiovascular:  Beta Blockers Failed - 08/07/2023 11:24 AM      Failed - Last BP in normal range    BP Readings from Last 1 Encounters:  06/05/23 (!) 163/101         Passed - Last Heart Rate in normal range    Pulse Readings from Last 1 Encounters:  06/05/23 86         Passed - Valid encounter within last 6 months    Recent Outpatient Visits           2 months ago Uncontrolled hypertension   [facility], MD   3 months ago Essential hypertension   [facility], MD   4 months ago Encounter for weight management   [facility], MD   5 months ago Encounter for weight management   [facility], MD   7 months ago Essential hypertension   [facility], MD

## 2023-08-08 ENCOUNTER — Other Ambulatory Visit (HOSPITAL_COMMUNITY): Payer: Self-pay

## 2023-08-11 ENCOUNTER — Ambulatory Visit: Payer: Self-pay | Admitting: *Deleted

## 2023-08-11 NOTE — Telephone Encounter (Signed)
  Chief Complaint: thigh pain- bilateral- worse on left Symptoms: thigh pain with walking- better with rest, no swelling-but feels heaviness,"tightness" Frequency: almost 2 weeks Pertinent Negatives: Patient denies chest pain, back pain, breathing difficulty, swelling, rash, fever, numbness Disposition: [] ED /[] Urgent Care (no appt availability in office) / [] Appointment(In office/virtual)/ []  [facility]/ [] Home Care/ [] Refused Recommended Disposition /[] [place] [facility]/ [x]  Follow-up with PCP Additional Notes: Patient has been scheduled for appointment- 09/08/23- would like sooner appointment- patient was scheduled out of disposition.

## 2023-08-11 NOTE — Telephone Encounter (Signed)
Summary: Bilateral leg pain   The patient called in stating she has noticed her legs have been aching for about a week and a half. She has taken tylenol which has helped a little but she is not sure what else to do. She scheduled an appt with Dr Andrey Campanile as she only wants to see her and is on a wait list but that is a month away. Please assist patient further.         Reason for Disposition  [1] Leg pain which occurs after walking a certain distance AND [2] disappears with rest AND [3] age > 71  Answer Assessment - Initial Assessment Questions 1. ONSET: "When did the pain start?"      Almost 2 weeks 2. LOCATION: "Where is the pain located?"      Left leg-worse, mostly if walking 3. PAIN: "How bad is the pain?"    (Scale 1-10; or mild, moderate, severe)   -  MILD (1-3): doesn't interfere with normal activities    -  MODERATE (4-7): interferes with normal activities (e.g., work or school) or awakens from sleep, limping    -  SEVERE (8-10): excruciating pain, unable to do any normal activities, unable to walk     Throbbing, aching pain, located in the thigh, mild 4. WORK OR EXERCISE: "Has there been any recent work or exercise that involved this part of the body?"      no 5. CAUSE: "What do you think is causing the leg pain?"     unsure 6. OTHER SYMPTOMS: "Do you have any other symptoms?" (e.g., chest pain, back pain, breathing difficulty, swelling, rash, fever, numbness, weakness)     Sometimes heaviness or tightness- not swelling  Protocols used: Leg Pain-A-AH

## 2023-08-11 NOTE — Telephone Encounter (Signed)
Pt rescheduled for sooner appt on 12/19

## 2023-08-12 ENCOUNTER — Other Ambulatory Visit (HOSPITAL_COMMUNITY): Payer: Self-pay

## 2023-08-14 ENCOUNTER — Ambulatory Visit (INDEPENDENT_AMBULATORY_CARE_PROVIDER_SITE_OTHER): Payer: No Typology Code available for payment source | Admitting: Family Medicine

## 2023-08-14 VITALS — BP 164/93 | HR 74 | Temp 98.1°F | Resp 16

## 2023-08-14 DIAGNOSIS — M79605 Pain in left leg: Secondary | ICD-10-CM | POA: Diagnosis not present

## 2023-08-14 DIAGNOSIS — M79604 Pain in right leg: Secondary | ICD-10-CM | POA: Diagnosis not present

## 2023-08-14 NOTE — Progress Notes (Signed)
Patient is her for leg pain that has been present for 2 weeks. Patient c/o pain is 8/10 with little relief from OTC medication

## 2023-08-18 ENCOUNTER — Encounter: Payer: Self-pay | Admitting: Family Medicine

## 2023-08-18 NOTE — Progress Notes (Signed)
Established Patient Office Visit  Subjective    Patient ID: Amy Schultz, female    DOB: 05-29-1956  Age: 67 y.o. MRN: [id number]  CC:  Chief Complaint  Patient presents with   Leg Pain    HPI Amy Schultz presents with complaint of bilateral leg pain. Patient denies known trauma or injury. She has has similar sx previously but now seems to be more persistent and worsening. She does reports some varicosities.   Outpatient Encounter Medications as of 08/14/2023  Medication Sig   aspirin 81 MG chewable tablet Chew by mouth daily.   losartan-hydrochlorothiazide (HYZAAR) 100-25 MG tablet Take 1 tablet by mouth daily.   metoprolol succinate (TOPROL-XL) 25 MG 24 hr tablet Take 1 tablet (25 mg total) by mouth daily.   phentermine (ADIPEX-P) 37.5 MG tablet Take 1 tablet (37.5 mg total) by mouth daily before breakfast. (Patient not taking: Reported on 08/14/2023)   No facility-administered encounter medications on file as of 08/14/2023.    Past Medical History:  Diagnosis Date   Hypertension    Postmenopausal bleeding    Rash    both ankles x 3 weeks prescribed hydroxyazine prn and kenalog cream at urgent care 11-03-2019   Thyroid disease     Past Surgical History:  Procedure Laterality Date   COLONOSCOPY     DILATATION & CURETTAGE/HYSTEROSCOPY WITH MYOSURE N/A 11/10/2019   Procedure: DILATATION & CURETTAGE/HYSTEROSCOPY WITH MYOSURE;  Surgeon: Maxie Better, MD;  Location: [facility];  Service: Gynecology;  Laterality: N/A;   THYROID SURGERY     didn't remove thyroid   TUBAL LIGATION      Family History  Problem Relation Age of Onset   Diabetes Mother    Hypertension Mother    Hypertension Sister    Breast cancer Sister        unsure age of onset   Breast cancer Maternal Aunt        unsure age of onset   Diabetes Maternal Grandmother    Colon cancer Neg Hx    Esophageal cancer Neg Hx    Rectal cancer Neg Hx    Stomach cancer Neg Hx    Colon  polyps Neg Hx     Social History   Socioeconomic History   Marital status: Single    Spouse name: Not on file   Number of children: 3   Years of education: Not on file   Highest education level: Not on file  Occupational History   Not on file  Tobacco Use   Smoking status: Never   Smokeless tobacco: Never  Vaping Use   Vaping status: Never Used  Substance and Sexual Activity   Alcohol use: No   Drug use: No   Sexual activity: Not Currently    Birth control/protection: Surgical  Other Topics Concern   Not on file  Social History Narrative   Not on file   Social Drivers of Health   Financial Resource Strain: Low Risk  (05/06/2023)   Overall Financial Resource Strain (CARDIA)    Difficulty of Paying Living Expenses: Not hard at all  Food Insecurity: No Food Insecurity (05/06/2023)   Hunger Vital Sign    Worried About Running Out of Food in the Last Year: Never true    Ran Out of Food in the Last Year: Never true  Transportation Needs: No Transportation Needs (05/06/2023)   PRAPARE - Administrator, Civil Service (Medical): No    Lack of Transportation (Non-Medical):  No  Physical Activity: Insufficiently Active (05/06/2023)   Exercise Vital Sign    Days of Exercise per Week: 3 days    Minutes of Exercise per Session: 30 min  Stress: No Stress Concern Present (05/06/2023)   [employer] of Occupational Health - Occupational Stress Questionnaire    Feeling of Stress : Not at all  Social Connections: Moderately Integrated (05/06/2023)   Social Connection and Isolation Panel [NHANES]    Frequency of Communication with Friends and Family: More than three times a week    Frequency of Social Gatherings with Friends and Family: More than three times a week    Attends Religious Services: More than 4 times per year    Active Member of [employer] or Organizations: Yes    Attends Banker Meetings: More than 4 times per year    Marital Status: Divorced   Intimate Partner Violence: Not At Risk (05/06/2023)   Humiliation, Afraid, Rape, and Kick questionnaire    Fear of Current or Ex-Partner: No    Emotionally Abused: No    Physically Abused: No    Sexually Abused: No    Review of Systems  All other systems reviewed and are negative.       Objective    BP (!) 164/93   Pulse 74   Temp 98.1 F (36.7 C) (Oral)   Resp 16   SpO2 95%   Physical Exam Vitals and nursing note reviewed.  Constitutional:      General: She is not in acute distress.    Appearance: She is obese.  Cardiovascular:     Rate and Rhythm: Normal rate and regular rhythm.  Pulmonary:     Effort: Pulmonary effort is normal.     Breath sounds: Normal breath sounds.  Musculoskeletal:        General: No swelling or deformity.     Right lower leg: No edema.     Left lower leg: No edema.     Comments: Bilateral lower extremities with varicosities.   Neurological:     General: No focal deficit present.     Mental Status: She is alert and oriented to person, place, and time.         Assessment & Plan:   Bilateral leg pain -     Ambulatory referral to Vascular Surgery   Recommend support hose and exercises.   No follow-ups on file.   Tommie Raymond, MD

## 2023-09-01 ENCOUNTER — Other Ambulatory Visit (HOSPITAL_COMMUNITY): Payer: Self-pay

## 2023-09-01 ENCOUNTER — Other Ambulatory Visit: Payer: Self-pay

## 2023-09-05 ENCOUNTER — Other Ambulatory Visit (HOSPITAL_COMMUNITY): Payer: Self-pay

## 2023-09-08 ENCOUNTER — Ambulatory Visit: Payer: No Typology Code available for payment source | Admitting: Family Medicine

## 2023-09-11 ENCOUNTER — Other Ambulatory Visit: Payer: Self-pay | Admitting: *Deleted

## 2023-09-11 DIAGNOSIS — M79606 Pain in leg, unspecified: Secondary | ICD-10-CM

## 2023-09-15 NOTE — Progress Notes (Unsigned)
Patient ID: Amy Schultz, female   DOB: [birth year], 68 y.o.   MRN: [id number]  Reason for Consult: No chief complaint on file.   Referred by Georganna Skeans, MD  Subjective:     HPI  Amy Schultz is a 68 y.o. female presenting for evaluation of bilateral leg pain.  She reports pain with walking and can make it approximately ***before having to stop.  The pain does not get better with rest.  She denies rest pain or nonhealing wounds.  She is a never smoker.  Past Medical History:  Diagnosis Date   Hypertension    Postmenopausal bleeding    Rash    both ankles x 3 weeks prescribed hydroxyazine prn and kenalog cream at urgent care 11-03-2019   Thyroid disease    Family History  Problem Relation Age of Onset   Diabetes Mother    Hypertension Mother    Hypertension Sister    Breast cancer Sister        unsure age of onset   Breast cancer Maternal Aunt        unsure age of onset   Diabetes Maternal Grandmother    Colon cancer Neg Hx    Esophageal cancer Neg Hx    Rectal cancer Neg Hx    Stomach cancer Neg Hx    Colon polyps Neg Hx    Past Surgical History:  Procedure Laterality Date   COLONOSCOPY     DILATATION & CURETTAGE/HYSTEROSCOPY WITH MYOSURE N/A 11/10/2019   Procedure: DILATATION & CURETTAGE/HYSTEROSCOPY WITH MYOSURE;  Surgeon: Maxie Better, MD;  Location: [facility];  Service: Gynecology;  Laterality: N/A;   THYROID SURGERY     didn't remove thyroid   TUBAL LIGATION      Short Social History:  Social History   Tobacco Use   Smoking status: Never   Smokeless tobacco: Never  Substance Use Topics   Alcohol use: No    Allergies  Allergen Reactions   Codeine Nausea Only   Darvocet [Propoxyphene N-Acetaminophen] Nausea Only   Demerol Nausea Only   Hydrocodone Nausea Only   Penicillins Nausea Only    Current Outpatient Medications  Medication Sig Dispense Refill   aspirin 81 MG chewable tablet Chew by mouth daily.      losartan-hydrochlorothiazide (HYZAAR) 100-25 MG tablet Take 1 tablet by mouth daily. 90 tablet 1   metoprolol succinate (TOPROL-XL) 25 MG 24 hr tablet Take 1 tablet (25 mg total) by mouth daily. 90 tablet 1   phentermine (ADIPEX-P) 37.5 MG tablet Take 1 tablet (37.5 mg total) by mouth daily before breakfast. (Patient not taking: Reported on 08/14/2023) 30 tablet 0   No current facility-administered medications for this visit.    REVIEW OF SYSTEMS  Positive for ***  All other systems were reviewed and are negative     Objective:  Objective   There were no vitals filed for this visit. There is no height or weight on file to calculate BMI.  Physical Exam General: no acute distress Cardiac: hemodynamically stable Pulm: normal work of breathing Abdomen: non-tender, no pulsatile mass*** Neuro: alert, no focal deficit Extremities: no edema, cyanosis or wounds*** Vascular:   Right: ***  Left: ***   Data: ABI ***     Assessment/Plan:     Amy Schultz is a 68 y.o. female with PAD and claudication.  Her symptoms are not currently lifestyle limiting.  For that reason I encouraged continued ambulation and walking program.  And explained that  she should walk 30 minutes a day about 5 days a week while trying to push through the pain for a few steps further before having to rest.  Explained this medical therapy is aspirin, statin and abstinence from tobacco products.  She is not currently on a statin.  Prescribed 53-month course of Lipitor which can be refilled by her PCP at her next follow-up. Plan for follow-up with me in 3 months with repeat ABI   Recommendations to optimize cardiovascular risk: Abstinence from all tobacco products. Blood glucose control with goal A1c < 7%. Blood pressure control with goal blood pressure < 140/90 mmHg. Lipid reduction therapy with goal LDL-C <100 mg/dL  Aspirin 81mg  PO QD.  Atorvastatin 40-80mg  PO QD (or other "high intensity" statin  therapy).     Daria Pastures MD [facility]

## 2023-09-16 ENCOUNTER — Ambulatory Visit (HOSPITAL_COMMUNITY)
Admission: RE | Admit: 2023-09-16 | Discharge: 2023-09-16 | Disposition: A | Discharge status: Home / Self Care | Payer: Medicare (Managed Care) | Source: Ambulatory Visit | Attending: Vascular Surgery | Admitting: Vascular Surgery

## 2023-09-16 ENCOUNTER — Encounter: Payer: Self-pay | Admitting: Vascular Surgery

## 2023-09-16 ENCOUNTER — Ambulatory Visit: Payer: Medicare (Managed Care) | Admitting: Vascular Surgery

## 2023-09-16 VITALS — BP 156/87 | HR 62 | Temp 98.3°F | Resp 20 | Ht 71.0 in | Wt 264.0 lb

## 2023-09-16 DIAGNOSIS — M79605 Pain in left leg: Secondary | ICD-10-CM | POA: Diagnosis not present

## 2023-09-16 DIAGNOSIS — M79606 Pain in leg, unspecified: Secondary | ICD-10-CM | POA: Diagnosis present

## 2023-09-16 DIAGNOSIS — I739 Peripheral vascular disease, unspecified: Secondary | ICD-10-CM | POA: Insufficient documentation

## 2023-09-16 DIAGNOSIS — I10 Essential (primary) hypertension: Secondary | ICD-10-CM | POA: Insufficient documentation

## 2023-09-16 DIAGNOSIS — M79604 Pain in right leg: Secondary | ICD-10-CM | POA: Diagnosis not present

## 2023-09-16 LAB — VAS US ABI WITH/WO TBI
Left ABI: 1.23
Right ABI: 1.18

## 2023-09-26 ENCOUNTER — Encounter: Payer: No Typology Code available for payment source | Admitting: Vascular Surgery

## 2023-09-26 ENCOUNTER — Encounter (HOSPITAL_COMMUNITY): Payer: No Typology Code available for payment source

## 2023-10-31 ENCOUNTER — Other Ambulatory Visit: Payer: Self-pay | Admitting: Family Medicine

## 2023-10-31 ENCOUNTER — Other Ambulatory Visit (HOSPITAL_COMMUNITY): Payer: Self-pay

## 2023-10-31 MED ORDER — LOSARTAN POTASSIUM-HCTZ 100-25 MG PO TABS
1.0000 | ORAL_TABLET | Freq: Every day | ORAL | 1 refills | Status: DC
  Filled 2023-10-31: qty 90, 90d supply, fill #0
  Filled 2024-02-04: qty 90, 90d supply, fill #1

## 2023-11-14 ENCOUNTER — Ambulatory Visit (INDEPENDENT_AMBULATORY_CARE_PROVIDER_SITE_OTHER): Payer: Medicare HMO | Admitting: Family Medicine

## 2023-11-14 ENCOUNTER — Encounter: Payer: Self-pay | Admitting: Family Medicine

## 2023-11-14 ENCOUNTER — Other Ambulatory Visit (HOSPITAL_COMMUNITY): Payer: Self-pay

## 2023-11-14 VITALS — BP 172/94 | HR 73 | Temp 98.5°F | Resp 16 | Ht 71.0 in | Wt 255.6 lb

## 2023-11-14 DIAGNOSIS — E66812 Obesity, class 2: Secondary | ICD-10-CM

## 2023-11-14 DIAGNOSIS — I10 Essential (primary) hypertension: Secondary | ICD-10-CM

## 2023-11-14 DIAGNOSIS — Z6835 Body mass index (BMI) 35.0-35.9, adult: Secondary | ICD-10-CM | POA: Diagnosis not present

## 2023-11-14 MED ORDER — AMLODIPINE BESYLATE 10 MG PO TABS
10.0000 mg | ORAL_TABLET | Freq: Every day | ORAL | 0 refills | Status: DC
  Filled 2023-11-14: qty 90, 90d supply, fill #0

## 2023-11-14 NOTE — Progress Notes (Signed)
 Established Patient Office Visit  Subjective    Patient ID: Amy Schultz, female    DOB: 1956-06-02  Age: 68 y.o. MRN: [id number]  CC:  Chief Complaint  Patient presents with   Follow-up    HPI Amy Schultz presents for follow up of hypertension. She reports med compliance and denies acute complaints.   Outpatient Encounter Medications as of 11/14/2023  Medication Sig   amLODipine (NORVASC) 10 MG tablet Take 1 tablet (10 mg total) by mouth daily.   aspirin 81 MG chewable tablet Chew by mouth daily.   losartan-hydrochlorothiazide (HYZAAR) 100-25 MG tablet Take 1 tablet by mouth daily.   [DISCONTINUED] metoprolol succinate (TOPROL-XL) 25 MG 24 hr tablet Take 1 tablet (25 mg total) by mouth daily.   phentermine (ADIPEX-P) 37.5 MG tablet Take 1 tablet (37.5 mg total) by mouth daily before breakfast. (Patient not taking: Reported on 09/16/2023)   No facility-administered encounter medications on file as of 11/14/2023.    Past Medical History:  Diagnosis Date   Hypertension    Postmenopausal bleeding    Rash    both ankles x 3 weeks prescribed hydroxyazine prn and kenalog cream at urgent care 11-03-2019   Thyroid disease     Past Surgical History:  Procedure Laterality Date   COLONOSCOPY     DILATATION & CURETTAGE/HYSTEROSCOPY WITH MYOSURE N/A 11/10/2019   Procedure: DILATATION & CURETTAGE/HYSTEROSCOPY WITH MYOSURE;  Surgeon: Amy Better, MD;  Location: [facility];  Service: Gynecology;  Laterality: N/A;   THYROID SURGERY     didn't remove thyroid   TUBAL LIGATION      Family History  Problem Relation Age of Onset   Diabetes Mother    Hypertension Mother    Hypertension Sister    Breast cancer Sister        unsure age of onset   Breast cancer Maternal Aunt        unsure age of onset   Diabetes Maternal Grandmother    Colon cancer Neg Hx    Esophageal cancer Neg Hx    Rectal cancer Neg Hx    Stomach cancer Neg Hx    Colon polyps Neg Hx      Social History   Socioeconomic History   Marital status: Single    Spouse name: Not on file   Number of children: 3   Years of education: Not on file   Highest education level: Not on file  Occupational History   Not on file  Tobacco Use   Smoking status: Never   Smokeless tobacco: Never  Vaping Use   Vaping status: Never Used  Substance and Sexual Activity   Alcohol use: No   Drug use: No   Sexual activity: Not Currently    Birth control/protection: Surgical  Other Topics Concern   Not on file  Social History Narrative   Not on file   Social Drivers of Health   Financial Resource Strain: Low Risk  (05/06/2023)   Overall Financial Resource Strain (CARDIA)    Difficulty of Paying Living Expenses: Not hard at all  Food Insecurity: No Food Insecurity (05/06/2023)   Hunger Vital Sign    Worried About Running Out of Food in the Last Year: Never true    Ran Out of Food in the Last Year: Never true  Transportation Needs: No Transportation Needs (05/06/2023)   PRAPARE - Administrator, Civil Service (Medical): No    Lack of Transportation (Non-Medical): No  Physical  Activity: Insufficiently Active (05/06/2023)   Exercise Vital Sign    Days of Exercise per Week: 3 days    Minutes of Exercise per Session: 30 min  Stress: No Stress Concern Present (05/06/2023)   [employer] of Occupational Health - Occupational Stress Questionnaire    Feeling of Stress : Not at all  Social Connections: Moderately Integrated (05/06/2023)   Social Connection and Isolation Panel [NHANES]    Frequency of Communication with Friends and Family: More than three times a week    Frequency of Social Gatherings with Friends and Family: More than three times a week    Attends Religious Services: More than 4 times per year    Active Member of [employer] or Organizations: Yes    Attends Banker Meetings: More than 4 times per year    Marital Status: Divorced  Intimate Partner  Violence: Not At Risk (05/06/2023)   Humiliation, Afraid, Rape, and Kick questionnaire    Fear of Current or Ex-Partner: No    Emotionally Abused: No    Physically Abused: No    Sexually Abused: No    Review of Systems  All other systems reviewed and are negative.       Objective    BP (!) 172/94   Pulse 73   Temp 98.5 F (36.9 C) (Oral)   Resp 16   Ht 5\' 11"  (1.803 m)   Wt 255 lb 9.6 oz (115.9 kg)   SpO2 95%   BMI 35.65 kg/m   Physical Exam Vitals and nursing note reviewed.  Constitutional:      General: She is not in acute distress.    Appearance: She is obese.  Cardiovascular:     Rate and Rhythm: Normal rate and regular rhythm.  Pulmonary:     Effort: Pulmonary effort is normal.     Breath sounds: Normal breath sounds.  Abdominal:     Palpations: Abdomen is soft.     Tenderness: There is no abdominal tenderness.  Neurological:     General: No focal deficit present.     Mental Status: She is alert and oriented to person, place, and time.         Assessment & Plan:  1. Essential hypertension (Primary) Will d/c metoprolol and start amlodipine 10 mg daily with regimen.   2. Class 2 severe obesity due to excess calories with serious comorbidity and body mass index (BMI) of 35.0 to 35.9 in adult [name]) [place] off phentermine 2/2 BP   Return in about 4 weeks (around 12/12/2023) for follow up, chronic med issues.   Amy Raymond, MD

## 2023-11-20 ENCOUNTER — Other Ambulatory Visit (HOSPITAL_COMMUNITY): Payer: Self-pay

## 2023-11-20 MED ORDER — AMOXICILLIN 500 MG PO CAPS
500.0000 mg | ORAL_CAPSULE | Freq: Three times a day (TID) | ORAL | 0 refills | Status: AC
  Filled 2023-11-20: qty 21, 7d supply, fill #0

## 2023-11-20 MED ORDER — CHLORHEXIDINE GLUCONATE 0.12 % MT SOLN
Freq: Two times a day (BID) | OROMUCOSAL | 0 refills | Status: AC
Start: 2023-11-20 — End: ?
  Filled 2023-11-20: qty 473, 10d supply, fill #0

## 2023-11-20 MED ORDER — ACETAMINOPHEN-CODEINE 300-30 MG PO TABS
1.0000 | ORAL_TABLET | Freq: Four times a day (QID) | ORAL | 0 refills | Status: AC | PRN
  Filled 2023-11-20: qty 10, 3d supply, fill #0

## 2023-12-18 ENCOUNTER — Encounter: Payer: Self-pay | Admitting: Family Medicine

## 2023-12-18 ENCOUNTER — Ambulatory Visit (INDEPENDENT_AMBULATORY_CARE_PROVIDER_SITE_OTHER): Admitting: Family Medicine

## 2023-12-18 ENCOUNTER — Other Ambulatory Visit: Payer: Self-pay

## 2023-12-18 ENCOUNTER — Other Ambulatory Visit (HOSPITAL_COMMUNITY): Payer: Self-pay

## 2023-12-18 VITALS — BP 149/87 | HR 68 | Wt 255.2 lb

## 2023-12-18 DIAGNOSIS — Z7689 Persons encountering health services in other specified circumstances: Secondary | ICD-10-CM | POA: Diagnosis not present

## 2023-12-18 DIAGNOSIS — Z6835 Body mass index (BMI) 35.0-35.9, adult: Secondary | ICD-10-CM

## 2023-12-18 DIAGNOSIS — E66812 Obesity, class 2: Secondary | ICD-10-CM

## 2023-12-18 DIAGNOSIS — I10 Essential (primary) hypertension: Secondary | ICD-10-CM | POA: Diagnosis not present

## 2023-12-18 MED ORDER — PHENTERMINE HCL 37.5 MG PO TABS
37.5000 mg | ORAL_TABLET | Freq: Every day | ORAL | 0 refills | Status: AC
Start: 2023-12-18 — End: ?
  Filled 2023-12-18: qty 30, 30d supply, fill #0

## 2023-12-18 NOTE — Progress Notes (Signed)
 Established Patient Office Visit  Subjective    Patient ID: Amy Schultz, female    DOB: September 18, 1955  Age: 68 y.o. MRN: [id number]  CC:  Chief Complaint  Patient presents with   Medical Management of Chronic Issues    HPI Amy Schultz presents for routine weight management and follow up of hypertension. Patient reports med compliance and denies acute complaints.   Outpatient Encounter Medications as of 12/18/2023  Medication Sig   acetaminophen -codeine  (TYLENOL  #3) 300-30 MG tablet Take 1 tablet by mouth every 6 (six) hours as needed for dental pain.   amLODipine  (NORVASC ) 10 MG tablet Take 1 tablet (10 mg total) by mouth daily.   aspirin 81 MG chewable tablet Chew by mouth daily.   chlorhexidine  (PERIDEX ) 0.12 % solution Swish for one minute and spit out twice a day for 10 days.   losartan -hydrochlorothiazide  (HYZAAR ) 100-25 MG tablet Take 1 tablet by mouth daily.   [DISCONTINUED] phentermine  (ADIPEX-P ) 37.5 MG tablet Take 1 tablet (37.5 mg total) by mouth daily before breakfast.   phentermine  (ADIPEX-P ) 37.5 MG tablet Take 1 tablet (37.5 mg total) by mouth daily before breakfast.   No facility-administered encounter medications on file as of 12/18/2023.    Past Medical History:  Diagnosis Date   Hypertension    Postmenopausal bleeding    Rash    both ankles x 3 weeks prescribed hydroxyazine prn and kenalog  cream at urgent care 11-03-2019   Thyroid  disease     Past Surgical History:  Procedure Laterality Date   COLONOSCOPY     DILATATION & CURETTAGE/HYSTEROSCOPY WITH MYOSURE N/A 11/10/2019   Procedure: DILATATION & CURETTAGE/HYSTEROSCOPY WITH MYOSURE;  Surgeon: Ivery Marking, MD;  Location: [facility];  Service: Gynecology;  Laterality: N/A;   THYROID  SURGERY     didn't remove thyroid    TUBAL LIGATION      Family History  Problem Relation Age of Onset   Diabetes Mother    Hypertension Mother    Hypertension Sister    Breast cancer Sister         unsure age of onset   Breast cancer Maternal Aunt        unsure age of onset   Diabetes Maternal Grandmother    Colon cancer Neg Hx    Esophageal cancer Neg Hx    Rectal cancer Neg Hx    Stomach cancer Neg Hx    Colon polyps Neg Hx     Social History   Socioeconomic History   Marital status: Single    Spouse name: Not on file   Number of children: 3   Years of education: Not on file   Highest education level: Not on file  Occupational History   Not on file  Tobacco Use   Smoking status: Never   Smokeless tobacco: Never  Vaping Use   Vaping status: Never Used  Substance and Sexual Activity   Alcohol use: No   Drug use: No   Sexual activity: Not Currently    Birth control/protection: Surgical  Other Topics Concern   Not on file  Social History Narrative   Not on file   Social Drivers of Health   Financial Resource Strain: Low Risk  (05/06/2023)   Overall Financial Resource Strain (CARDIA)    Difficulty of Paying Living Expenses: Not hard at all  Food Insecurity: No Food Insecurity (05/06/2023)   Hunger Vital Sign    Worried About Running Out of Food in the Last Year: Never true  Ran Out of Food in the Last Year: Never true  Transportation Needs: No Transportation Needs (05/06/2023)   PRAPARE - Administrator, Civil Service (Medical): No    Lack of Transportation (Non-Medical): No  Physical Activity: Insufficiently Active (05/06/2023)   Exercise Vital Sign    Days of Exercise per Week: 3 days    Minutes of Exercise per Session: 30 min  Stress: No Stress Concern Present (05/06/2023)   [employer] of Occupational Health - Occupational Stress Questionnaire    Feeling of Stress : Not at all  Social Connections: Moderately Integrated (05/06/2023)   Social Connection and Isolation Panel [NHANES]    Frequency of Communication with Friends and Family: More than three times a week    Frequency of Social Gatherings with Friends and Family: More than three  times a week    Attends Religious Services: More than 4 times per year    Active Member of [employer] or Organizations: Yes    Attends Banker Meetings: More than 4 times per year    Marital Status: Divorced  Intimate Partner Violence: Not At Risk (05/06/2023)   Humiliation, Afraid, Rape, and Kick questionnaire    Fear of Current or Ex-Partner: No    Emotionally Abused: No    Physically Abused: No    Sexually Abused: No    Review of Systems  All other systems reviewed and are negative.       Objective    BP (!) 149/87 (BP Location: Right Arm, Patient Position: Sitting, Cuff Size: Large)   Pulse 68   Wt 255 lb 3.2 oz (115.8 kg)   SpO2 97%   BMI 35.59 kg/m   Physical Exam Vitals and nursing note reviewed.  Constitutional:      General: She is not in acute distress.    Appearance: She is obese.  Cardiovascular:     Rate and Rhythm: Normal rate and regular rhythm.  Pulmonary:     Effort: Pulmonary effort is normal.     Breath sounds: Normal breath sounds.  Abdominal:     Palpations: Abdomen is soft.     Tenderness: There is no abdominal tenderness.  Neurological:     General: No focal deficit present.     Mental Status: She is alert and oriented to person, place, and time.         Assessment & Plan:   Essential hypertension  Encounter for weight management  Class 2 severe obesity due to excess calories with serious comorbidity and body mass index (BMI) of 35.0 to 35.9 in adult [facility])  Other orders -     Phentermine  HCl; Take 1 tablet (37.5 mg total) by mouth daily before breakfast.  Dispense: 30 tablet; Refill: 0     Return in about 4 weeks (around 01/15/2024) for follow up, chronic med issues.   Arlo Lama, MD

## 2023-12-22 ENCOUNTER — Encounter: Payer: Self-pay | Admitting: Family Medicine

## 2023-12-23 ENCOUNTER — Other Ambulatory Visit (HOSPITAL_COMMUNITY): Payer: Self-pay

## 2024-02-04 ENCOUNTER — Other Ambulatory Visit: Payer: Self-pay | Admitting: Family Medicine

## 2024-02-04 ENCOUNTER — Other Ambulatory Visit (HOSPITAL_COMMUNITY): Payer: Self-pay

## 2024-02-05 ENCOUNTER — Other Ambulatory Visit: Payer: Self-pay

## 2024-02-05 ENCOUNTER — Other Ambulatory Visit (HOSPITAL_COMMUNITY): Payer: Self-pay

## 2024-02-05 MED ORDER — AMLODIPINE BESYLATE 10 MG PO TABS
10.0000 mg | ORAL_TABLET | Freq: Every day | ORAL | 0 refills | Status: DC
  Filled 2024-02-05: qty 90, 90d supply, fill #0

## 2024-02-05 NOTE — Telephone Encounter (Signed)
 Requested Prescriptions  Pending Prescriptions Disp Refills   amLODipine  (NORVASC ) 10 MG tablet 90 tablet 0    Sig: Take 1 tablet (10 mg total) by mouth daily.     Cardiovascular: Calcium Channel Blockers 2 Failed - 02/05/2024  9:44 AM      Failed - Last BP in normal range    BP Readings from Last 1 Encounters:  12/18/23 (!) 149/87         Passed - Last Heart Rate in normal range    Pulse Readings from Last 1 Encounters:  12/18/23 68         Passed - Valid encounter within last 6 months    Recent Outpatient Visits           1 month ago Essential hypertension   [facility] [place] [facility], MD   2 months ago Essential hypertension   [facility], MD   5 months ago Bilateral leg pain   [place] [facility], MD   8 months ago Uncontrolled hypertension   [place] [facility], MD   9 months ago Essential hypertension   [place] [facility] Ray Caffey, MD               phentermine  (ADIPEX-P ) 37.5 MG tablet 30 tablet 0    Sig: Take 1 tablet (37.5 mg total) by mouth daily before breakfast.     Not Delegated - Neurology: Anticonvulsants - Controlled - phentermine  hydrochloride Failed - 02/05/2024  9:44 AM      Failed - This refill cannot be delegated      Failed - eGFR in normal range and within 360 days    GFR calc Af Amer  Date Value Ref Range Status  09/19/2020 93 >59 mL/min/1.73 Final    Comment:    **In accordance with recommendations from the NKF-ASN Task force,**   [facility] is in the process of updating its eGFR calculation to the   2021 CKD-EPI creatinine equation that estimates kidney function   without a race variable.    GFR, Estimated  Date Value Ref Range Status  11/18/2022 >60 >60 mL/min Final    Comment:    (NOTE) Calculated using the CKD-EPI Creatinine Equation (2021)    eGFR   Date Value Ref Range Status  04/02/2022 72 >59 mL/min/1.73 Final         Failed - Cr in normal range and within 360 days    Creatinine, Ser  Date Value Ref Range Status  11/18/2022 0.78 0.44 - 1.00 mg/dL Final         Failed - Last BP in normal range    BP Readings from Last 1 Encounters:  12/18/23 (!) 149/87         Passed - Valid encounter within last 6 months    Recent Outpatient Visits           1 month ago Essential hypertension   [place] [facility] [facility], MD   2 months ago Essential hypertension   [place] [facility], MD   5 months ago Bilateral leg pain   [place] [facility], MD   8 months ago Uncontrolled hypertension   [place] [facility], MD   9 months ago Essential  hypertension   [place] [facility] Ray Caffey, MD              Passed - Weight completed in the last 3 months    Wt Readings from Last 1 Encounters:  12/18/23 255 lb 3.2 oz (115.8 kg)

## 2024-02-05 NOTE — Telephone Encounter (Signed)
 Requested medication (s) are due for refill today: yes  Requested medication (s) are on the active medication list: yes  Last refill:  12/18/23  Future visit scheduled: yes  Notes to clinic:  Unable to refill per protocol, cannot delegate.      Requested Prescriptions  Pending Prescriptions Disp Refills   phentermine  (ADIPEX-P ) 37.5 MG tablet 30 tablet 0    Sig: Take 1 tablet (37.5 mg total) by mouth daily before breakfast.     Not Delegated - Neurology: Anticonvulsants - Controlled - phentermine  hydrochloride Failed - 02/05/2024  9:45 AM      Failed - This refill cannot be delegated      Failed - eGFR in normal range and within 360 days    GFR calc Af Amer  Date Value Ref Range Status  09/19/2020 93 >59 mL/min/1.73 Final    Comment:    **In accordance with recommendations from the NKF-ASN Task force,**   [facility] is in the process of updating its eGFR calculation to the   2021 CKD-EPI creatinine equation that estimates kidney function   without a race variable.    GFR, Estimated  Date Value Ref Range Status  11/18/2022 >60 >60 mL/min Final    Comment:    (NOTE) Calculated using the CKD-EPI Creatinine Equation (2021)    eGFR  Date Value Ref Range Status  04/02/2022 72 >59 mL/min/1.73 Final         Failed - Cr in normal range and within 360 days    Creatinine, Ser  Date Value Ref Range Status  11/18/2022 0.78 0.44 - 1.00 mg/dL Final         Failed - Last BP in normal range    BP Readings from Last 1 Encounters:  12/18/23 (!) 149/87         Passed - Valid encounter within last 6 months    Recent Outpatient Visits           1 month ago Essential hypertension   [place] [facility], MD   2 months ago Essential hypertension   [place] [facility], MD   5 months ago Bilateral leg pain   [place] [facility], MD   8 months ago Uncontrolled  hypertension   [place] [facility], Ray Caffey, MD   9 months ago Essential hypertension   [place] [facility], Ray Caffey, MD              Passed - Weight completed in the last 3 months    Wt Readings from Last 1 Encounters:  12/18/23 255 lb 3.2 oz (115.8 kg)         Signed Prescriptions Disp Refills   amLODipine  (NORVASC ) 10 MG tablet 90 tablet 0    Sig: Take 1 tablet (10 mg total) by mouth daily.     Cardiovascular: Calcium Channel Blockers 2 Failed - 02/05/2024  9:45 AM      Failed - Last BP in normal range    BP Readings from Last 1 Encounters:  12/18/23 (!) 149/87         Passed - Last Heart Rate in normal range    Pulse Readings from Last 1 Encounters:  12/18/23 68         Passed - Valid encounter within last 6 months    Recent Outpatient Visits  1 month ago Essential hypertension   [place] [facility], MD   2 months ago Essential hypertension   [place] [facility], MD   5 months ago Bilateral leg pain   [place] [facility], MD   8 months ago Uncontrolled hypertension   [place] [facility], MD   9 months ago Essential hypertension   [place] [facility], MD

## 2024-02-09 ENCOUNTER — Other Ambulatory Visit (HOSPITAL_COMMUNITY): Payer: Self-pay

## 2024-02-09 MED ORDER — PHENTERMINE HCL 37.5 MG PO TABS
37.5000 mg | ORAL_TABLET | Freq: Every day | ORAL | 0 refills | Status: DC
  Filled 2024-02-09: qty 30, 30d supply, fill #0

## 2024-02-16 ENCOUNTER — Ambulatory Visit: Payer: Self-pay | Admitting: *Deleted

## 2024-02-16 NOTE — Telephone Encounter (Signed)
  FYI Only or Action Required?: Action required by provider: request for appointment.  Patient was last seen in primary care on 12/18/2023 by Tanda Bleacher, MD. Called Nurse Triage reporting Rash. Symptoms began yesterday. Interventions attempted: Nothing. Symptoms are: gradually worsening.  Triage Disposition: See Physician Within 24 Hours  Patient/caregiver understands and will follow disposition?: Yes                    Copied from CRM [id number]. Topic: Clinical - Red Word Triage >> Feb 16, 2024  4:07 PM Selinda RAMAN wrote: Red Word that prompted transfer to Nurse Triage: The patient called in stating she is extremely uncomfortable and she has a rash all over her back and under her arms. She said she is red all over and itches something fierce. I will transfer her to E2C2 NT Reason for Disposition  SEVERE itching (i.e., interferes with sleep, normal activities or school)  Answer Assessment - Initial Assessment Questions 1. APPEARANCE of RASH: Describe the rash. (e.g., spots, blisters, raised areas, skin peeling, scaly)     Different sizes and shapes raised  2. SIZE: How big are the spots? (e.g., tip of pen, eraser, coin; inches, centimeters)     Smaller than dime size  3. LOCATION: Where is the rash located?     Under arms , back, between breasts  and stomach  4. COLOR: What color is the rash? (Note: It is difficult to assess rash color in people with darker-colored skin. When this situation occurs, simply ask the caller to describe what they see.)     Redness noted  5. ONSET: When did the rash begin?     Yesterday  6. FEVER: Do you have a fever? If Yes, ask: What is your temperature, how was it measured, and when did it start?     na 7. ITCHING: Does the rash itch? If Yes, ask: How bad is the itch? (Scale 1-10; or mild, moderate, severe)     Itching severe 8. CAUSE: What do you think is causing the rash?     Not sure  9. MEDICINE FACTORS: Have you  started any new medicines within the last 2 weeks? (e.g., antibiotics)      No  10. OTHER SYMPTOMS: Do you have any other symptoms? (e.g., dizziness, headache, sore throat, joint pain)       No . But did notice refills of amlodipine   bigger size and losartan  looks like a different and size.  11. PREGNANCY: Is there any chance you are pregnant? When was your last menstrual period?       Na   Appt scheduled for tomorrow with PCP. Recommended cool compresses for itching, hydrocortisone cream, antihistamine.  Protocols used: Rash or Redness - [facility]

## 2024-02-16 NOTE — Telephone Encounter (Signed)
 noted

## 2024-02-17 ENCOUNTER — Encounter: Payer: Self-pay | Admitting: Family Medicine

## 2024-02-17 ENCOUNTER — Ambulatory Visit (INDEPENDENT_AMBULATORY_CARE_PROVIDER_SITE_OTHER): Admitting: Family Medicine

## 2024-02-17 ENCOUNTER — Other Ambulatory Visit (HOSPITAL_COMMUNITY): Payer: Self-pay

## 2024-02-17 VITALS — BP 127/82 | HR 73 | Wt 256.0 lb

## 2024-02-17 DIAGNOSIS — L299 Pruritus, unspecified: Secondary | ICD-10-CM | POA: Diagnosis not present

## 2024-02-17 DIAGNOSIS — B372 Candidiasis of skin and nail: Secondary | ICD-10-CM | POA: Diagnosis not present

## 2024-02-17 DIAGNOSIS — I10 Essential (primary) hypertension: Secondary | ICD-10-CM | POA: Diagnosis not present

## 2024-02-17 MED ORDER — CLOTRIMAZOLE 1 % EX CREA
1.0000 | TOPICAL_CREAM | Freq: Two times a day (BID) | CUTANEOUS | 0 refills | Status: AC
  Filled 2024-02-17: qty 28, 14d supply, fill #0

## 2024-02-17 MED ORDER — TRIAMCINOLONE ACETONIDE 0.1 % EX CREA
1.0000 | TOPICAL_CREAM | Freq: Two times a day (BID) | CUTANEOUS | 0 refills | Status: AC
  Filled 2024-02-17: qty 30, 15d supply, fill #0

## 2024-02-17 MED ORDER — HYDROXYZINE PAMOATE 25 MG PO CAPS
25.0000 mg | ORAL_CAPSULE | Freq: Three times a day (TID) | ORAL | 0 refills | Status: AC | PRN
  Filled 2024-02-17: qty 30, 10d supply, fill #0

## 2024-02-18 ENCOUNTER — Encounter: Payer: Self-pay | Admitting: Family Medicine

## 2024-02-18 NOTE — Progress Notes (Signed)
 Established Patient Office Visit  Subjective    Patient ID: Amy Schultz, female    DOB: [birth year]  Age: 68 y.o. MRN: [id number]  CC:  Chief Complaint  Patient presents with   Rash    Whole body with itching     HPI Amy Schultz presents with complaint of rash under her breasts and in her inguinal folds primarily. She does report increased sweating. Rash is itchy  Outpatient Encounter Medications as of 02/17/2024  Medication Sig   acetaminophen -codeine  (TYLENOL  #3) 300-30 MG tablet Take 1 tablet by mouth every 6 (six) hours as needed for dental pain.   amLODipine  (NORVASC ) 10 MG tablet Take 1 tablet (10 mg total) by mouth daily.   aspirin 81 MG chewable tablet Chew by mouth daily.   chlorhexidine  (PERIDEX ) 0.12 % solution Swish for one minute and spit out twice a day for 10 days.   clotrimazole (LOTRIMIN) 1 % cream Apply 1 Application topically 2 (two) times daily.   hydrOXYzine  (VISTARIL ) 25 MG capsule Take 1 capsule (25 mg total) by mouth every 8 (eight) hours as needed.   losartan -hydrochlorothiazide  (HYZAAR ) 100-25 MG tablet Take 1 tablet by mouth daily.   phentermine  (ADIPEX-P ) 37.5 MG tablet Take 1 tablet (37.5 mg total) by mouth daily before breakfast.   triamcinolone  cream (KENALOG ) 0.1 % Apply 1 Application topically 2 (two) times daily.   No facility-administered encounter medications on file as of 02/17/2024.    Past Medical History:  Diagnosis Date   Hypertension    Postmenopausal bleeding    Rash    both ankles x 3 weeks prescribed hydroxyazine prn and kenalog  cream at urgent care 11-03-2019   Thyroid  disease     Past Surgical History:  Procedure Laterality Date   COLONOSCOPY     DILATATION & CURETTAGE/HYSTEROSCOPY WITH MYOSURE N/A 11/10/2019   Procedure: DILATATION & CURETTAGE/HYSTEROSCOPY WITH MYOSURE;  Surgeon: Rutherford Gain, MD;  Location: [facility];  Service: Gynecology;  Laterality: N/A;   THYROID  SURGERY     didn't remove  thyroid    TUBAL LIGATION      Family History  Problem Relation Age of Onset   Diabetes Mother    Hypertension Mother    Hypertension Sister    Breast cancer Sister        unsure age of onset   Breast cancer Maternal Aunt        unsure age of onset   Diabetes Maternal Grandmother    Colon cancer Neg Hx    Esophageal cancer Neg Hx    Rectal cancer Neg Hx    Stomach cancer Neg Hx    Colon polyps Neg Hx     Social History   Socioeconomic History   Marital status: Single    Spouse name: Not on file   Number of children: 3   Years of education: Not on file   Highest education level: Not on file  Occupational History   Not on file  Tobacco Use   Smoking status: Never   Smokeless tobacco: Never  Vaping Use   Vaping status: Never Used  Substance and Sexual Activity   Alcohol use: No   Drug use: No   Sexual activity: Not Currently    Birth control/protection: Surgical  Other Topics Concern   Not on file  Social History Narrative   Not on file   Social Drivers of Health   Financial Resource Strain: Low Risk  (05/06/2023)   Overall Financial Resource Strain (CARDIA)  Difficulty of Paying Living Expenses: Not hard at all  Food Insecurity: No Food Insecurity (05/06/2023)   Hunger Vital Sign    Worried About Running Out of Food in the Last Year: Never true    Ran Out of Food in the Last Year: Never true  Transportation Needs: No Transportation Needs (05/06/2023)   PRAPARE - Administrator, Civil Service (Medical): No    Lack of Transportation (Non-Medical): No  Physical Activity: Insufficiently Active (05/06/2023)   Exercise Vital Sign    Days of Exercise per Week: 3 days    Minutes of Exercise per Session: 30 min  Stress: No Stress Concern Present (05/06/2023)   [employer] of Occupational Health - Occupational Stress Questionnaire    Feeling of Stress : Not at all  Social Connections: Moderately Integrated (05/06/2023)   Social Connection and  Isolation Panel    Frequency of Communication with Friends and Family: More than three times a week    Frequency of Social Gatherings with Friends and Family: More than three times a week    Attends Religious Services: More than 4 times per year    Active Member of [employer] or Organizations: Yes    Attends Banker Meetings: More than 4 times per year    Marital Status: Divorced  Intimate Partner Violence: Not At Risk (05/06/2023)   Humiliation, Afraid, Rape, and Kick questionnaire    Fear of Current or Ex-Partner: No    Emotionally Abused: No    Physically Abused: No    Sexually Abused: No    Review of Systems  Skin:  Positive for itching and rash.  All other systems reviewed and are negative.       Objective    BP 127/82 (BP Location: Right Arm, Patient Position: Sitting, Cuff Size: Large)   Pulse 73   Wt 256 lb (116.1 kg)   SpO2 97%   BMI 35.70 kg/m   Physical Exam Vitals and nursing note reviewed.  Constitutional:      General: She is not in acute distress.  Cardiovascular:     Rate and Rhythm: Normal rate and regular rhythm.  Pulmonary:     Effort: Pulmonary effort is normal.     Breath sounds: Normal breath sounds.   Skin:    Findings: Rash present.   Neurological:     General: No focal deficit present.     Mental Status: She is alert and oriented to person, place, and time.         Assessment & Plan:   1. Essential hypertension (Primary) Appears stable. continue  2. Candidal dermatitis Lotrimin and triamcinolone  prescribed. Skin care discussed  3. Itching Hydroxyzine  prescribed.  No follow-ups on file.   Tanda Raguel SQUIBB, MD

## 2024-03-18 ENCOUNTER — Ambulatory Visit: Admitting: Family Medicine

## 2024-03-25 DIAGNOSIS — H25013 Cortical age-related cataract, bilateral: Secondary | ICD-10-CM | POA: Diagnosis not present

## 2024-03-25 DIAGNOSIS — H16223 Keratoconjunctivitis sicca, not specified as Sjogren's, bilateral: Secondary | ICD-10-CM | POA: Diagnosis not present

## 2024-03-25 DIAGNOSIS — H2513 Age-related nuclear cataract, bilateral: Secondary | ICD-10-CM | POA: Diagnosis not present

## 2024-03-25 DIAGNOSIS — H40013 Open angle with borderline findings, low risk, bilateral: Secondary | ICD-10-CM | POA: Diagnosis not present

## 2024-04-06 DIAGNOSIS — H524 Presbyopia: Secondary | ICD-10-CM | POA: Diagnosis not present

## 2024-04-14 ENCOUNTER — Other Ambulatory Visit: Payer: Self-pay | Admitting: Family Medicine

## 2024-04-14 DIAGNOSIS — Z1231 Encounter for screening mammogram for malignant neoplasm of breast: Secondary | ICD-10-CM

## 2024-04-21 ENCOUNTER — Other Ambulatory Visit (HOSPITAL_COMMUNITY): Payer: Self-pay

## 2024-04-21 ENCOUNTER — Encounter: Payer: Self-pay | Admitting: Family Medicine

## 2024-04-21 ENCOUNTER — Ambulatory Visit (INDEPENDENT_AMBULATORY_CARE_PROVIDER_SITE_OTHER): Admitting: Family Medicine

## 2024-04-21 VITALS — BP 126/82 | HR 77 | Ht 71.0 in | Wt 258.4 lb

## 2024-04-21 DIAGNOSIS — E66812 Obesity, class 2: Secondary | ICD-10-CM

## 2024-04-21 DIAGNOSIS — Z6836 Body mass index (BMI) 36.0-36.9, adult: Secondary | ICD-10-CM | POA: Diagnosis not present

## 2024-04-21 DIAGNOSIS — I10 Essential (primary) hypertension: Secondary | ICD-10-CM | POA: Diagnosis not present

## 2024-04-21 DIAGNOSIS — Z7689 Persons encountering health services in other specified circumstances: Secondary | ICD-10-CM

## 2024-04-21 MED ORDER — PHENTERMINE HCL 37.5 MG PO TABS
37.5000 mg | ORAL_TABLET | Freq: Every day | ORAL | 0 refills | Status: AC
  Filled 2024-04-21: qty 30, 30d supply, fill #0

## 2024-04-21 NOTE — Progress Notes (Signed)
 Established Patient Office Visit  Subjective    Patient ID: Amy Schultz, female    DOB: [birth year]  Age: 68 y.o. MRN: [id number]  CC:  Chief Complaint  Patient presents with   Medical Management of Chronic Issues    Due for Medicare Annual Wellness Exam     HPI Amy Schultz presents for routine weight management and hypertension.   Outpatient Encounter Medications as of 04/21/2024  Medication Sig   amLODipine  (NORVASC ) 10 MG tablet Take 1 tablet (10 mg total) by mouth daily.   aspirin 81 MG chewable tablet Chew by mouth daily.   losartan -hydrochlorothiazide  (HYZAAR ) 100-25 MG tablet Take 1 tablet by mouth daily.   acetaminophen -codeine  (TYLENOL  #3) 300-30 MG tablet Take 1 tablet by mouth every 6 (six) hours as needed for dental pain. (Patient not taking: Reported on 04/21/2024)   chlorhexidine  (PERIDEX ) 0.12 % solution Swish for one minute and spit out twice a day for 10 days. (Patient not taking: Reported on 04/21/2024)   clotrimazole  (LOTRIMIN ) 1 % cream Apply 1 Application topically 2 (two) times daily. (Patient not taking: Reported on 04/21/2024)   hydrOXYzine  (VISTARIL ) 25 MG capsule Take 1 capsule (25 mg total) by mouth every 8 (eight) hours as needed. (Patient not taking: Reported on 04/21/2024)   phentermine  (ADIPEX-P ) 37.5 MG tablet Take 1 tablet (37.5 mg total) by mouth daily before breakfast.   triamcinolone  cream (KENALOG ) 0.1 % Apply 1 Application topically 2 (two) times daily. (Patient not taking: Reported on 04/21/2024)   [DISCONTINUED] phentermine  (ADIPEX-P ) 37.5 MG tablet Take 1 tablet (37.5 mg total) by mouth daily before breakfast. (Patient not taking: Reported on 04/21/2024)   No facility-administered encounter medications on file as of 04/21/2024.    Past Medical History:  Diagnosis Date   Hypertension    Postmenopausal bleeding    Rash    both ankles x 3 weeks prescribed hydroxyazine prn and kenalog  cream at urgent care 11-03-2019   Thyroid  disease     Past  Surgical History:  Procedure Laterality Date   COLONOSCOPY     DILATATION & CURETTAGE/HYSTEROSCOPY WITH MYOSURE N/A 11/10/2019   Procedure: DILATATION & CURETTAGE/HYSTEROSCOPY WITH MYOSURE;  Surgeon: Rutherford Gain, MD;  Location: [facility];  Service: Gynecology;  Laterality: N/A;   THYROID  SURGERY     didn't remove thyroid    TUBAL LIGATION      Family History  Problem Relation Age of Onset   Diabetes Mother    Hypertension Mother    Hypertension Sister    Breast cancer Sister        unsure age of onset   Breast cancer Maternal Aunt        unsure age of onset   Diabetes Maternal Grandmother    Colon cancer Neg Hx    Esophageal cancer Neg Hx    Rectal cancer Neg Hx    Stomach cancer Neg Hx    Colon polyps Neg Hx     Social History   Socioeconomic History   Marital status: Single    Spouse name: Not on file   Number of children: 3   Years of education: Not on file   Highest education level: Not on file  Occupational History   Not on file  Tobacco Use   Smoking status: Never   Smokeless tobacco: Never  Vaping Use   Vaping status: Never Used  Substance and Sexual Activity   Alcohol use: No   Drug use: No   Sexual activity: Not  Currently    Birth control/protection: Surgical  Other Topics Concern   Not on file  Social History Narrative   Not on file   Social Drivers of Health   Financial Resource Strain: Low Risk  (05/06/2023)   Overall Financial Resource Strain (CARDIA)    Difficulty of Paying Living Expenses: Not hard at all  Food Insecurity: No Food Insecurity (05/06/2023)   Hunger Vital Sign    Worried About Running Out of Food in the Last Year: Never true    Ran Out of Food in the Last Year: Never true  Transportation Needs: No Transportation Needs (05/06/2023)   PRAPARE - Administrator, Civil Service (Medical): No    Lack of Transportation (Non-Medical): No  Physical Activity: Insufficiently Active (05/06/2023)   Exercise  Vital Sign    Days of Exercise per Week: 3 days    Minutes of Exercise per Session: 30 min  Stress: No Stress Concern Present (05/06/2023)   [employer] of Occupational Health - Occupational Stress Questionnaire    Feeling of Stress : Not at all  Social Connections: Moderately Integrated (05/06/2023)   Social Connection and Isolation Panel    Frequency of Communication with Friends and Family: More than three times a week    Frequency of Social Gatherings with Friends and Family: More than three times a week    Attends Religious Services: More than 4 times per year    Active Member of [employer] or Organizations: Yes    Attends Banker Meetings: More than 4 times per year    Marital Status: Divorced  Intimate Partner Violence: Not At Risk (05/06/2023)   Humiliation, Afraid, Rape, and Kick questionnaire    Fear of Current or Ex-Partner: No    Emotionally Abused: No    Physically Abused: No    Sexually Abused: No    Review of Systems  All other systems reviewed and are negative.       Objective    BP 126/82   Pulse 77   Ht 5' 11 (1.803 m)   Wt 258 lb 6.4 oz (117.2 kg)   SpO2 95%   BMI 36.04 kg/m   Physical Exam Vitals and nursing note reviewed.  Constitutional:      General: She is not in acute distress.    Appearance: She is obese.  Cardiovascular:     Rate and Rhythm: Normal rate and regular rhythm.  Pulmonary:     Effort: Pulmonary effort is normal.     Breath sounds: Normal breath sounds.  Abdominal:     Palpations: Abdomen is soft.     Tenderness: There is no abdominal tenderness.  Neurological:     General: No focal deficit present.     Mental Status: She is alert and oriented to person, place, and time.         Assessment & Plan:   Essential hypertension  Encounter for weight management  Class 2 severe obesity due to excess calories with serious comorbidity and body mass index (BMI) of 36.0 to 36.9 in adult [facility])  Other orders -      Phentermine  HCl; Take 1 tablet (37.5 mg total) by mouth daily before breakfast.  Dispense: 30 tablet; Refill: 0     Return in about 4 weeks (around 05/19/2024) for follow up, weight management.   Tanda Raguel SQUIBB, MD

## 2024-04-30 ENCOUNTER — Other Ambulatory Visit (HOSPITAL_COMMUNITY): Payer: Self-pay

## 2024-05-05 ENCOUNTER — Other Ambulatory Visit (HOSPITAL_COMMUNITY): Payer: Self-pay

## 2024-05-05 ENCOUNTER — Other Ambulatory Visit: Payer: Self-pay | Admitting: Family Medicine

## 2024-05-05 MED ORDER — LOSARTAN POTASSIUM-HCTZ 100-25 MG PO TABS
1.0000 | ORAL_TABLET | Freq: Every day | ORAL | 1 refills | Status: AC
  Filled 2024-05-05: qty 90, 90d supply, fill #0
  Filled 2024-08-04: qty 90, 90d supply, fill #1

## 2024-05-05 MED ORDER — AMLODIPINE BESYLATE 10 MG PO TABS
10.0000 mg | ORAL_TABLET | Freq: Every day | ORAL | 0 refills | Status: DC
  Filled 2024-05-05: qty 90, 90d supply, fill #0

## 2024-05-07 ENCOUNTER — Ambulatory Visit
Admission: RE | Admit: 2024-05-07 | Discharge: 2024-05-07 | Disposition: A | Discharge status: Home / Self Care | Source: Ambulatory Visit | Attending: Family Medicine | Admitting: Family Medicine

## 2024-05-07 DIAGNOSIS — Z1231 Encounter for screening mammogram for malignant neoplasm of breast: Secondary | ICD-10-CM | POA: Diagnosis not present

## 2024-05-17 ENCOUNTER — Ambulatory Visit (INDEPENDENT_AMBULATORY_CARE_PROVIDER_SITE_OTHER)

## 2024-05-17 VITALS — Ht 71.0 in | Wt 258.0 lb

## 2024-05-17 DIAGNOSIS — Z0000 Encounter for general adult medical examination without abnormal findings: Secondary | ICD-10-CM | POA: Diagnosis not present

## 2024-05-17 DIAGNOSIS — Z780 Asymptomatic menopausal state: Secondary | ICD-10-CM | POA: Diagnosis not present

## 2024-05-17 NOTE — Progress Notes (Signed)
 Subjective:   Amy Schultz is a 68 y.o. who presents for a Medicare Wellness preventive visit.  As a reminder, Annual Wellness Visits don't include a physical exam, and some assessments may be limited, especially if this visit is performed virtually. We may recommend an in-person follow-up visit with your provider if needed.  Visit Complete: Virtual I connected with  [name] on 05/17/24 by a audio enabled telemedicine application and verified that I am speaking with the correct person using two identifiers.  Patient Location: Home  Provider Location: Home Office  I discussed the limitations of evaluation and management by telemedicine. The patient expressed understanding and agreed to proceed.  Vital Signs: Because this visit was a virtual/telehealth visit, some criteria may be missing or patient reported. Any vitals not documented were not able to be obtained and vitals that have been documented are patient reported.  VideoDeclined- This patient declined Librarian, academic. Therefore the visit was completed with audio only.  Persons Participating in Visit: Patient.  AWV Questionnaire: No: Patient Medicare AWV questionnaire was not completed prior to this visit.  Cardiac Risk Factors include: advanced age (>62men, >37 women);hypertension;Other (see comment), Risk factor comments: OSA     Objective:    Today's Vitals   05/17/24 0808  Weight: 258 lb (117 kg)  Height: 5' 11 (1.803 m)   Body mass index is 35.98 kg/m.     05/17/2024    8:15 AM 11/18/2022   10:27 PM 11/10/2019    1:07 PM 12/09/2017    8:17 AM  Advanced Directives  Does Patient Have a Medical Advance Directive? No No No No   Would patient like information on creating a medical advance directive?   No - Patient declined No - Patient declined      Data saved with a previous flowsheet row definition    Current Medications (verified) Outpatient Encounter Medications as of  05/17/2024  Medication Sig   amLODipine  (NORVASC ) 10 MG tablet Take 1 tablet (10 mg total) by mouth daily.   aspirin 81 MG chewable tablet Chew by mouth daily.   losartan -hydrochlorothiazide  (HYZAAR ) 100-25 MG tablet Take 1 tablet by mouth daily.   phentermine  (ADIPEX-P ) 37.5 MG tablet Take 1 tablet (37.5 mg total) by mouth daily before breakfast.   acetaminophen -codeine  (TYLENOL  #3) 300-30 MG tablet Take 1 tablet by mouth every 6 (six) hours as needed for dental pain. (Patient not taking: Reported on 05/17/2024)   chlorhexidine  (PERIDEX ) 0.12 % solution Swish for one minute and spit out twice a day for 10 days. (Patient not taking: Reported on 05/17/2024)   clotrimazole  (LOTRIMIN ) 1 % cream Apply 1 Application topically 2 (two) times daily. (Patient not taking: Reported on 05/17/2024)   hydrOXYzine  (VISTARIL ) 25 MG capsule Take 1 capsule (25 mg total) by mouth every 8 (eight) hours as needed. (Patient not taking: Reported on 05/17/2024)   triamcinolone  cream (KENALOG ) 0.1 % Apply 1 Application topically 2 (two) times daily. (Patient not taking: Reported on 05/17/2024)   No facility-administered encounter medications on file as of 05/17/2024.    Allergies (verified) Codeine , Darvocet [propoxyphene n-acetaminophen ], Demerol, Hydrocodone , and Penicillins   History: Past Medical History:  Diagnosis Date   Hypertension    Postmenopausal bleeding    Rash    both ankles x 3 weeks prescribed hydroxyazine prn and kenalog  cream at urgent care 11-03-2019   Thyroid  disease    Past Surgical History:  Procedure Laterality Date   COLONOSCOPY  DILATATION & CURETTAGE/HYSTEROSCOPY WITH MYOSURE N/A 11/10/2019   Procedure: DILATATION & CURETTAGE/HYSTEROSCOPY WITH MYOSURE;  Surgeon: Rutherford Gain, MD;  Location: [facility] [place];  Service: Gynecology;  Laterality: N/A;   THYROID  SURGERY     didn't remove thyroid    TUBAL LIGATION     Family History  Problem Relation Age of Onset   Diabetes  Mother    Hypertension Mother    Hypertension Sister    Breast cancer Sister        unsure age of onset   Breast cancer Maternal Aunt        unsure age of onset   Diabetes Maternal Grandmother    Colon cancer Neg Hx    Esophageal cancer Neg Hx    Rectal cancer Neg Hx    Stomach cancer Neg Hx    Colon polyps Neg Hx    Social History   Socioeconomic History   Marital status: Single    Spouse name: Not on file   Number of children: 3   Years of education: Not on file   Highest education level: Not on file  Occupational History   Occupation: RETIRED  Tobacco Use   Smoking status: Never   Smokeless tobacco: Never  Vaping Use   Vaping status: Never Used  Substance and Sexual Activity   Alcohol use: No   Drug use: No   Sexual activity: Not Currently    Birth control/protection: Surgical  Other Topics Concern   Not on file  Social History Narrative   Lives alone/2025   Social Drivers of Health   Financial Resource Strain: Medium Risk (05/17/2024)   Overall Financial Resource Strain (CARDIA)    Difficulty of Paying Living Expenses: Somewhat hard  Food Insecurity: Food Insecurity Present (05/17/2024)   Hunger Vital Sign    Worried About Running Out of Food in the Last Year: Sometimes true    Ran Out of Food in the Last Year: Never true  Transportation Needs: No Transportation Needs (05/17/2024)   PRAPARE - Administrator, Civil Service (Medical): No    Lack of Transportation (Non-Medical): No  Physical Activity: Insufficiently Active (05/17/2024)   Exercise Vital Sign    Days of Exercise per Week: 3 days    Minutes of Exercise per Session: 10 min  Stress: No Stress Concern Present (05/17/2024)   [employer] of Occupational Health - Occupational Stress Questionnaire    Feeling of Stress: Not at all  Social Connections: Moderately Integrated (05/17/2024)   Social Connection and Isolation Panel    Frequency of Communication with Friends and Family: More than  three times a week    Frequency of Social Gatherings with Friends and Family: More than three times a week    Attends Religious Services: More than 4 times per year    Active Member of [employer] or Organizations: Yes    Attends Engineer, structural: More than 4 times per year    Marital Status: Divorced    Tobacco Counseling Counseling given: Not Answered    Clinical Intake:  Pre-visit preparation completed: Yes  Pain : No/denies pain     BMI - recorded: 35.98 Nutritional Status: BMI > 30  Obese Nutritional Risks: None Diabetes: No  Lab Results  Component Value Date   HGBA1C 6.1 (H) 04/02/2022   HGBA1C 6.2 (A) 09/19/2020   HGBA1C 5.9 (H) 02/14/2020     How often do you need to have someone help you when you read instructions, pamphlets, or  other written materials from your doctor or pharmacy?: 1 - Never  Interpreter Needed?: No  Information entered by :: Ayse Mccartin, RMA   Activities of Daily Living     05/17/2024    8:08 AM  In your present state of health, do you have any difficulty performing the following activities:  Hearing? 0  Vision? 0  Difficulty concentrating or making decisions? 0  Walking or climbing stairs? 0  Dressing or bathing? 0  Doing errands, shopping? 0  Preparing Food and eating ? N  Using the Toilet? N  In the past six months, have you accidently leaked urine? N  Do you have problems with loss of bowel control? N  Managing your Medications? N  Managing your Finances? N  Housekeeping or managing your Housekeeping? N    Patient Care Team: Tanda Bleacher, MD as PCP - General (Family Medicine)  I have updated your Care Teams any recent Medical Services you may have received from other providers in the past year.     Assessment:   This is a routine wellness examination for [facility].  Hearing/Vision screen Hearing Screening - Comments:: Denies hearing difficulties   Vision Screening - Comments:: Wears eyeglasses/Patient  forgot name of office   Goals Addressed               This Visit's Progress     Patient Stated (pt-stated)        Not at this time/2025        Depression Screen     05/17/2024    8:18 AM 12/18/2023    9:11 AM 11/14/2023    8:32 AM 08/14/2023    9:36 AM 06/05/2023    9:35 AM 05/06/2023    8:56 AM 01/03/2023    8:17 AM  PHQ 2/9 Scores  PHQ - 2 Score 0 0 0 0 0 0 0  PHQ- 9 Score 0   0   0    Fall Risk     05/17/2024    8:15 AM 04/21/2024    8:13 AM 02/17/2024    9:22 AM 12/18/2023    8:21 AM 11/14/2023    8:32 AM  Fall Risk   Falls in the past year? 0 0 0 0 0  Number falls in past yr: 0 0 0 0 0  Injury with Fall? 0  0 0 0  Risk for fall due to :  No Fall Risks No Fall Risks No Fall Risks No Fall Risks  Follow up Falls evaluation completed;Falls prevention discussed Falls evaluation completed Falls evaluation completed Falls evaluation completed Falls evaluation completed    MEDICARE RISK AT HOME:  Medicare Risk at Home Any stairs in or around the home?: No If so, are there any without handrails?: No Home free of loose throw rugs in walkways, pet beds, electrical cords, etc?: Yes Adequate lighting in your home to reduce risk of falls?: Yes Life alert?: No Use of a cane, walker or w/c?: No Grab bars in the bathroom?: No Shower chair or bench in shower?: No Elevated toilet seat or a handicapped toilet?: No  TIMED UP AND GO:  Was the test performed?  No  Cognitive Function: Declined/Normal: No cognitive concerns noted by patient or family. Patient alert, oriented, able to answer questions appropriately and recall recent events. No signs of memory loss or confusion.        Immunizations Immunization History  Administered Date(s) Administered   DTP 01/13/2009   Influenza,inj,Quad PF,6+ Mos 04/27/2019  Influenza-Unspecified 03/26/2018, 06/25/2020   PFIZER(Purple Top)SARS-COV-2 Vaccination 05/16/2020, 06/09/2020   Tdap 08/27/2015    Screening Tests Health  Maintenance  Topic Date Due   Medicare Annual Wellness (AWV)  Never done   DEXA SCAN  Never done   Influenza Vaccine  03/26/2024   Pneumococcal Vaccine: 50+ Years (1 of 1 - PCV) 11/13/2024 (Originally 01/28/2008)   Mammogram  05/07/2025   DTaP/Tdap/Td (3 - Td or Tdap) 08/26/2025   Colonoscopy  04/01/2030   Hepatitis C Screening  Completed   HPV VACCINES  Aged Out   Meningococcal B Vaccine  Aged Out   COVID-19 Vaccine  Discontinued   Zoster Vaccines- Shingrix  Discontinued    Health Maintenance Items Addressed: DEXA ordered, See Nurse Notes at the end of this note  Additional Screening:  Vision Screening: Recommended annual ophthalmology exams for early detection of glaucoma and other disorders of the eye. Is the patient up to date with their annual eye exam?  No  Who is the provider or what is the name of the office in which the patient attends annual eye exams? Patient does not recall the name of office.  Dental Screening: Recommended annual dental exams for proper oral hygiene  Community Resource Referral / Chronic Care Management: CRR required this visit?  No   CCM required this visit?  No   Plan:    I have personally reviewed and noted the following in the patient's chart:   Medical and social history Use of alcohol, tobacco or illicit drugs  Current medications and supplements including opioid prescriptions. Patient is not currently taking opioid prescriptions. Functional ability and status Nutritional status Physical activity Advanced directives List of other physicians Hospitalizations, surgeries, and ER visits in previous 12 months Vitals Screenings to include cognitive, depression, and falls Referrals and appointments  In addition, I have reviewed and discussed with patient certain preventive protocols, quality metrics, and best practice recommendations. A written personalized care plan for preventive services as well as general preventive health  recommendations were provided to patient.   Alyx Gee L Fabrizio Filip, CMA   05/17/2024   After Visit Summary: (MyChart) Due to this being a telephonic visit, the after visit summary with patients personalized plan was offered to patient via MyChart   Notes: Patient declines Flu and pneumonia vaccines.  She is due for a DEXA and order has been placed today.  Patient had no other concerns to address today.

## 2024-05-17 NOTE — Patient Instructions (Signed)
 Amy Schultz,  Thank you for taking the time for your Medicare Wellness Visit. I appreciate your continued commitment to your health goals. Please review the care plan we discussed, and feel free to reach out if I can assist you further.  Medicare recommends these wellness visits once per year to help you and your care team stay ahead of potential health issues. These visits are designed to focus on prevention, allowing your provider to concentrate on managing your acute and chronic conditions during your regular appointments.  Please note that Annual Wellness Visits do not include a physical exam. Some assessments may be limited, especially if the visit was conducted virtually. If needed, we may recommend a separate in-person follow-up with your provider.  Ongoing Care Seeing your primary care provider every 3 to 6 months helps us  monitor your health and provide consistent, personalized care. Last office visit on 04/21/2024.    Referrals If a referral was made during today's visit and you haven't received any updates within two weeks, please contact the referred provider directly to check on the status. You have an order for:  [x]   Bone Density     Please call for appointment: [facility] [address] [place], [place] [address] [phone]  Make sure to wear two-piece clothing.  No lotions, powders, or deodorants the day of the appointment. Make sure to bring picture ID and insurance card.  Bring list of medications you are currently taking including any supplements.    Recommended Screenings:  Health Maintenance  Topic Date Due   Medicare Annual Wellness Visit  Never done   DEXA scan (bone density measurement)  Never done   Flu Shot  03/26/2024   Pneumococcal Vaccine for age over 35 (1 of 1 - PCV) 11/13/2024*   Breast Cancer Screening  05/07/2025   DTaP/Tdap/Td vaccine (3 - Td or Tdap) 08/26/2025   Colon Cancer Screening  04/01/2030   Hepatitis C  Screening  Completed   HPV Vaccine  Aged Out   Meningitis B Vaccine  Aged Out   COVID-19 Vaccine  Discontinued   Zoster (Shingles) Vaccine  Discontinued  *Topic was postponed. The date shown is not the original due date.       05/17/2024    8:15 AM  Advanced Directives  Does Patient Have a Medical Advance Directive? No   Advance Care Planning is important because it: Ensures you receive medical care that aligns with your values, goals, and preferences. Provides guidance to your family and loved ones, reducing the emotional burden of decision-making during critical moments.  Vision: Annual vision screenings are recommended for early detection of glaucoma, cataracts, and diabetic retinopathy. These exams can also reveal signs of chronic conditions such as diabetes and high blood pressure.  Dental: Annual dental screenings help detect early signs of oral cancer, gum disease, and other conditions linked to overall health, including heart disease and diabetes.  Please see the attached documents for additional preventive care recommendations.

## 2024-06-01 DIAGNOSIS — Z6835 Body mass index (BMI) 35.0-35.9, adult: Secondary | ICD-10-CM | POA: Diagnosis not present

## 2024-06-01 DIAGNOSIS — Z7982 Long term (current) use of aspirin: Secondary | ICD-10-CM | POA: Diagnosis not present

## 2024-06-01 DIAGNOSIS — H269 Unspecified cataract: Secondary | ICD-10-CM | POA: Diagnosis not present

## 2024-06-01 DIAGNOSIS — I10 Essential (primary) hypertension: Secondary | ICD-10-CM | POA: Diagnosis not present

## 2024-06-07 ENCOUNTER — Ambulatory Visit: Admitting: Family Medicine

## 2024-08-04 ENCOUNTER — Other Ambulatory Visit: Payer: Self-pay | Admitting: Family Medicine

## 2024-08-04 ENCOUNTER — Other Ambulatory Visit (HOSPITAL_COMMUNITY): Payer: Self-pay

## 2024-08-05 ENCOUNTER — Other Ambulatory Visit (HOSPITAL_COMMUNITY): Payer: Self-pay

## 2024-08-05 MED ORDER — AMLODIPINE BESYLATE 10 MG PO TABS
10.0000 mg | ORAL_TABLET | Freq: Every day | ORAL | 0 refills | Status: AC
  Filled 2024-08-05: qty 90, 90d supply, fill #0

## 2024-08-05 NOTE — Telephone Encounter (Signed)
 Pt scheduled

## 2024-09-29 ENCOUNTER — Ambulatory Visit: Payer: Self-pay | Admitting: Family Medicine

## 2024-10-21 ENCOUNTER — Ambulatory Visit: Admitting: Family Medicine
